# Patient Record
Sex: Male | Born: 1970 | Race: White | Hispanic: No | Smoking: Never smoker
Health system: Southern US, Community
[De-identification: ages and names within clinical notes are randomized; demographics above are authoritative.]

## PROBLEM LIST (undated history)

## (undated) DIAGNOSIS — I4510 Unspecified right bundle-branch block: Secondary | ICD-10-CM

## (undated) DIAGNOSIS — K5904 Chronic idiopathic constipation: Secondary | ICD-10-CM

## (undated) DIAGNOSIS — E1140 Type 2 diabetes mellitus with diabetic neuropathy, unspecified: Secondary | ICD-10-CM

## (undated) DIAGNOSIS — Z87442 Personal history of urinary calculi: Secondary | ICD-10-CM

## (undated) DIAGNOSIS — E785 Hyperlipidemia, unspecified: Secondary | ICD-10-CM

## (undated) DIAGNOSIS — G4700 Insomnia, unspecified: Secondary | ICD-10-CM

## (undated) DIAGNOSIS — E782 Mixed hyperlipidemia: Secondary | ICD-10-CM

## (undated) DIAGNOSIS — E119 Type 2 diabetes mellitus without complications: Secondary | ICD-10-CM

## (undated) DIAGNOSIS — F4310 Post-traumatic stress disorder, unspecified: Secondary | ICD-10-CM

## (undated) DIAGNOSIS — N529 Male erectile dysfunction, unspecified: Secondary | ICD-10-CM

## (undated) DIAGNOSIS — K603 Anal fistula, unspecified: Secondary | ICD-10-CM

## (undated) DIAGNOSIS — I10 Essential (primary) hypertension: Secondary | ICD-10-CM

## (undated) DIAGNOSIS — E1129 Type 2 diabetes mellitus with other diabetic kidney complication: Secondary | ICD-10-CM

## (undated) DIAGNOSIS — F32A Depression, unspecified: Secondary | ICD-10-CM

## (undated) DIAGNOSIS — M5030 Other cervical disc degeneration, unspecified cervical region: Secondary | ICD-10-CM

## (undated) DIAGNOSIS — Z9189 Other specified personal risk factors, not elsewhere classified: Secondary | ICD-10-CM

## (undated) DIAGNOSIS — R809 Proteinuria, unspecified: Secondary | ICD-10-CM

## (undated) DIAGNOSIS — R5382 Chronic fatigue, unspecified: Secondary | ICD-10-CM

## (undated) DIAGNOSIS — E291 Testicular hypofunction: Secondary | ICD-10-CM

## (undated) DIAGNOSIS — N183 Chronic kidney disease, stage 3 unspecified: Secondary | ICD-10-CM

## (undated) DIAGNOSIS — M1990 Unspecified osteoarthritis, unspecified site: Secondary | ICD-10-CM

## (undated) HISTORY — DX: Type 2 diabetes mellitus with diabetic neuropathy, unspecified: E11.40

## (undated) HISTORY — DX: Depression, unspecified: F32.A

## (undated) HISTORY — DX: Unspecified osteoarthritis, unspecified site: M19.90

## (undated) HISTORY — DX: Chronic kidney disease, stage 3 unspecified: N18.30

## (undated) HISTORY — DX: Type 2 diabetes mellitus without complications: E11.9

## (undated) HISTORY — PX: OTHER SURGICAL HISTORY: SHX169

## (undated) HISTORY — DX: Hyperlipidemia, unspecified: E78.5

---
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
## 2000-06-07 ENCOUNTER — Inpatient Hospital Stay (HOSPITAL_COMMUNITY): Admission: EM | Admit: 2000-06-07 | Discharge: 2000-06-10 | Payer: Self-pay | Admitting: *Deleted

## 2001-05-06 ENCOUNTER — Encounter (HOSPITAL_COMMUNITY): Admission: RE | Admit: 2001-05-06 | Discharge: 2001-06-05 | Payer: Self-pay | Admitting: Orthopedic Surgery

## 2002-02-05 ENCOUNTER — Emergency Department (HOSPITAL_COMMUNITY): Admission: EM | Admit: 2002-02-05 | Discharge: 2002-02-05 | Payer: Self-pay | Admitting: Emergency Medicine

## 2002-02-05 ENCOUNTER — Encounter: Payer: Self-pay | Admitting: Emergency Medicine

## 2005-10-21 ENCOUNTER — Emergency Department: Payer: Self-pay | Admitting: Emergency Medicine

## 2010-02-28 ENCOUNTER — Emergency Department: Payer: Self-pay | Admitting: Emergency Medicine

## 2010-11-14 ENCOUNTER — Emergency Department: Payer: Self-pay | Admitting: Emergency Medicine

## 2011-11-28 IMAGING — CR RIGHT FOOT - 2 VIEW
1 series · 2 of 2 positions shown · non-contrast
Comparison: none

REASON FOR EXAM: pain in heel  each morning while walking    flex 3
COMMENTS:   LMP: (Male)

PROCEDURE:     DXR - DXR FOOT RIGHT AP AND LATERAL  - November 14, 2010  [DATE]
RESULT:     There is no evidence of fracture, dislocation, or malalignment.

[Series 1: view not recorded · 0.17mm/px · 2 of 2 slices shown]
[im 1/2]
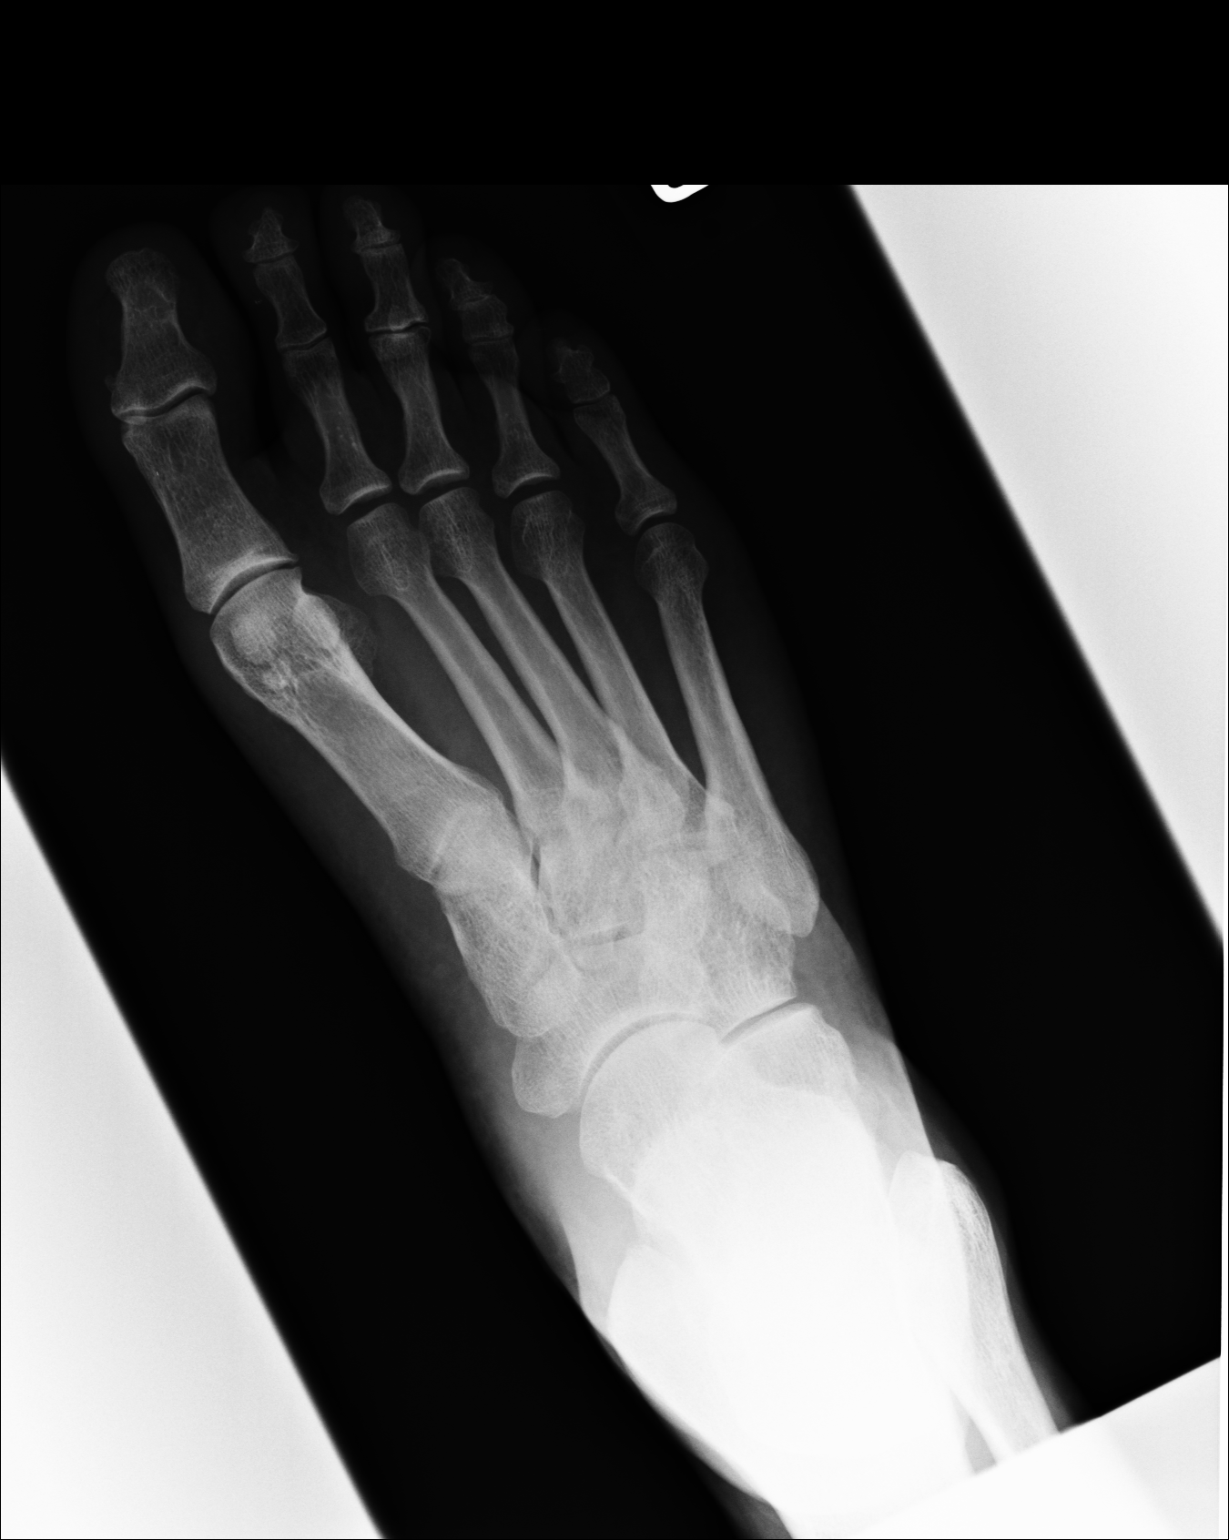
[im 2/2]
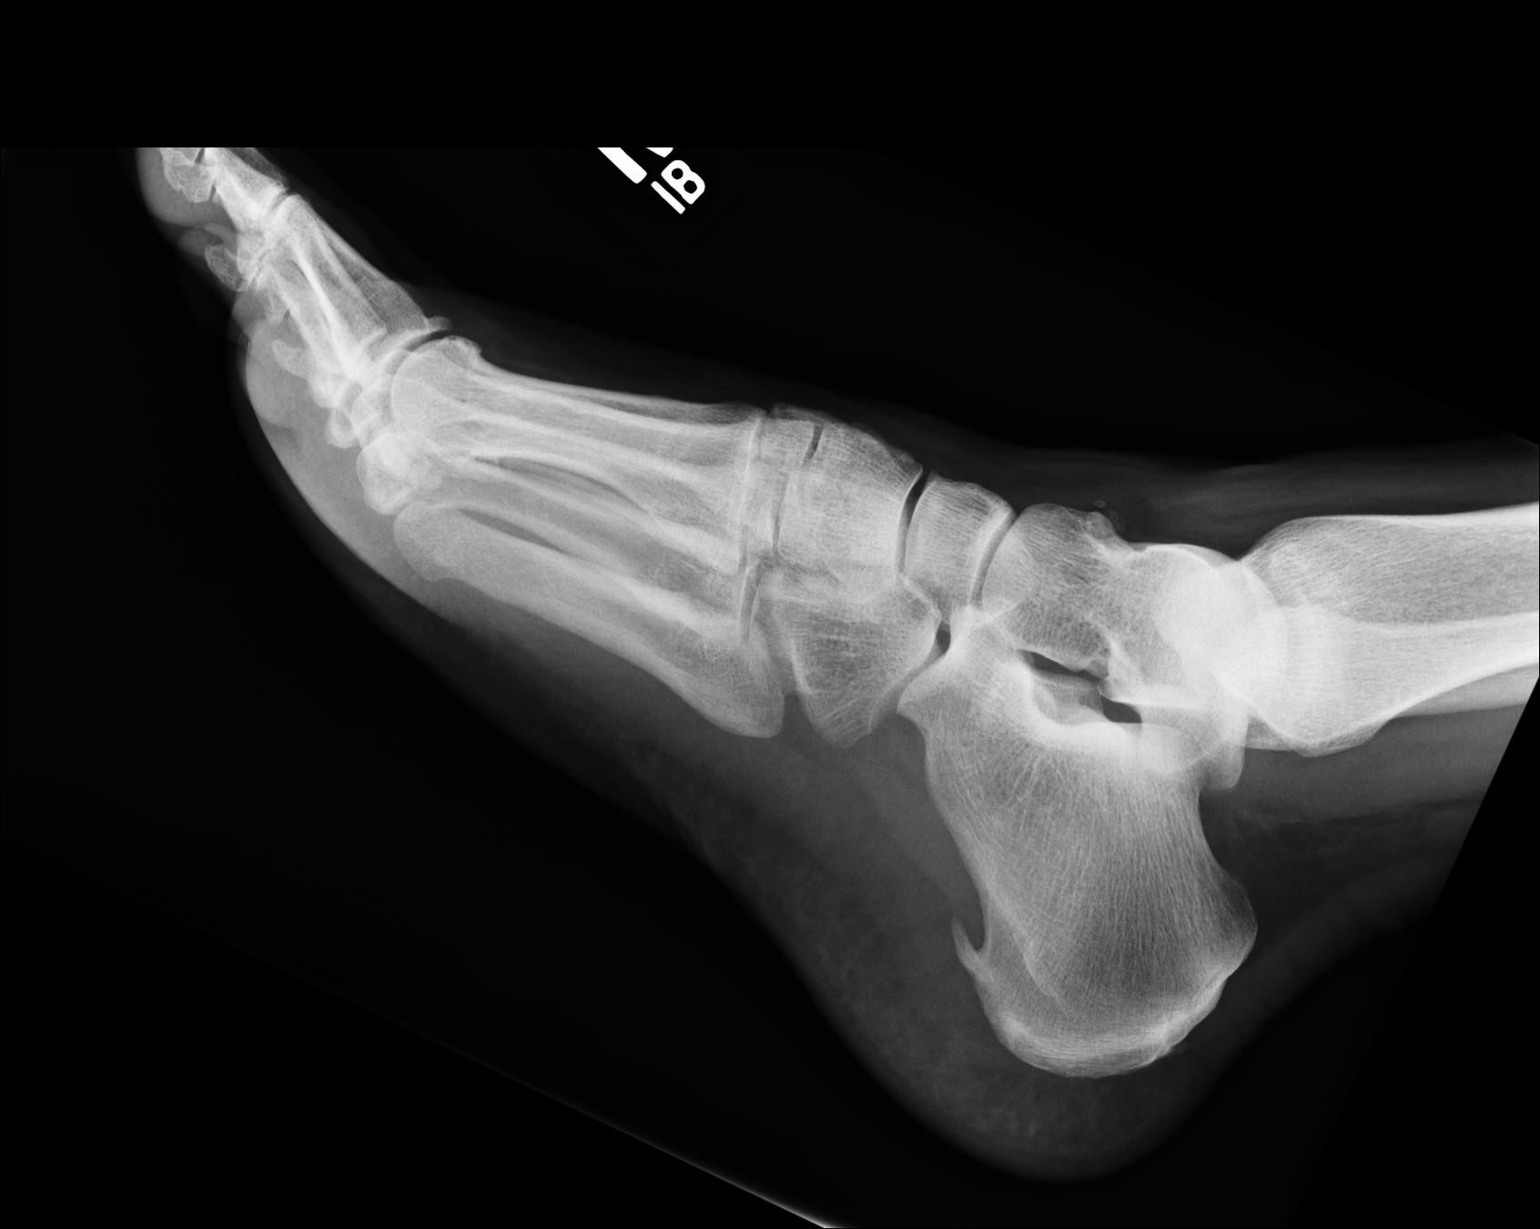

[2 of 2 positions shown; findings below may reference images not displayed]

IMPRESSION: 1. No evidence of acute abnormalities.
2. If there are persistent complaints of pain or persistent clinical
concern, a repeat evaluation in 7-10 days is recommended if clinically
warranted.

## 2019-10-24 ENCOUNTER — Emergency Department
Admission: EM | Admit: 2019-10-24 | Discharge: 2019-10-24 | Disposition: A | Discharge status: Home / Self Care | Payer: No Typology Code available for payment source | Attending: Emergency Medicine | Admitting: Emergency Medicine

## 2019-10-24 ENCOUNTER — Other Ambulatory Visit: Payer: Self-pay

## 2019-10-24 ENCOUNTER — Emergency Department: Payer: No Typology Code available for payment source

## 2019-10-24 ENCOUNTER — Encounter: Payer: Self-pay | Admitting: Emergency Medicine

## 2019-10-24 DIAGNOSIS — M542 Cervicalgia: Secondary | ICD-10-CM | POA: Diagnosis present

## 2019-10-24 DIAGNOSIS — Y9389 Activity, other specified: Secondary | ICD-10-CM | POA: Insufficient documentation

## 2019-10-24 DIAGNOSIS — M545 Low back pain: Secondary | ICD-10-CM | POA: Insufficient documentation

## 2019-10-24 DIAGNOSIS — Y999 Unspecified external cause status: Secondary | ICD-10-CM | POA: Diagnosis not present

## 2019-10-24 DIAGNOSIS — M79645 Pain in left finger(s): Secondary | ICD-10-CM | POA: Insufficient documentation

## 2019-10-24 DIAGNOSIS — Y92414 Local residential or business street as the place of occurrence of the external cause: Secondary | ICD-10-CM | POA: Diagnosis not present

## 2019-10-24 MED ORDER — CYCLOBENZAPRINE HCL 5 MG PO TABS: ORAL_TABLET | ORAL | 0 refills | Status: DC

## 2019-10-24 MED ORDER — LIDOCAINE 5 % EX PTCH: 1.0000 | MEDICATED_PATCH | CUTANEOUS | 0 refills | Status: DC

## 2019-10-24 MED ORDER — NAPROXEN SODIUM 220 MG PO TABS: 220.0000 mg | ORAL_TABLET | Freq: Two times a day (BID) | ORAL | 0 refills | Status: DC | PRN
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
## 2019-10-24 NOTE — Discharge Instructions (Addendum)
Your x-rays and CT do not show any fractures.  Please begin Aleve for pain and inflammation and Flexeril to help relax your muscles.  You can apply Lidoderm patch to your neck or low back.  Please follow-up with primary care next week for recheck.
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
## 2021-04-26 ENCOUNTER — Encounter: Payer: Self-pay | Admitting: Internal Medicine

## 2021-04-26 ENCOUNTER — Other Ambulatory Visit: Payer: Self-pay

## 2021-04-26 ENCOUNTER — Ambulatory Visit (INDEPENDENT_AMBULATORY_CARE_PROVIDER_SITE_OTHER): Payer: Medicare Other | Admitting: Internal Medicine

## 2021-04-26 VITALS — BP 162/94 | HR 82 | Temp 99.1°F | Resp 18 | Ht 74.0 in | Wt 330.8 lb

## 2021-04-26 DIAGNOSIS — R030 Elevated blood-pressure reading, without diagnosis of hypertension: Secondary | ICD-10-CM | POA: Diagnosis not present

## 2021-04-26 DIAGNOSIS — G4700 Insomnia, unspecified: Secondary | ICD-10-CM

## 2021-04-26 DIAGNOSIS — Z7689 Persons encountering health services in other specified circumstances: Secondary | ICD-10-CM

## 2021-04-26 DIAGNOSIS — Z1159 Encounter for screening for other viral diseases: Secondary | ICD-10-CM

## 2021-04-26 DIAGNOSIS — F1290 Cannabis use, unspecified, uncomplicated: Secondary | ICD-10-CM | POA: Diagnosis not present

## 2021-04-26 DIAGNOSIS — L0882 Omphalitis not of newborn: Secondary | ICD-10-CM

## 2021-04-26 DIAGNOSIS — M7730 Calcaneal spur, unspecified foot: Secondary | ICD-10-CM

## 2021-04-26 DIAGNOSIS — Z131 Encounter for screening for diabetes mellitus: Secondary | ICD-10-CM

## 2021-04-26 DIAGNOSIS — F4310 Post-traumatic stress disorder, unspecified: Secondary | ICD-10-CM

## 2021-04-26 DIAGNOSIS — Z114 Encounter for screening for human immunodeficiency virus [HIV]: Secondary | ICD-10-CM

## 2021-04-26 DIAGNOSIS — E559 Vitamin D deficiency, unspecified: Secondary | ICD-10-CM

## 2021-04-26 MED ORDER — CLOBETASOL PROPIONATE 0.05 % EX CREA: 1.0000 "application " | TOPICAL_CREAM | Freq: Two times a day (BID) | CUTANEOUS | 0 refills | Status: DC

## 2021-04-26 MED ORDER — TRAZODONE HCL 50 MG PO TABS: 50.0000 mg | ORAL_TABLET | Freq: Every evening | ORAL | 0 refills | Status: DC | PRN

## 2021-04-26 NOTE — Assessment & Plan Note (Signed)
Serous/mucoid discharge from umbilical area Clobetasol cream prescribed

## 2021-04-26 NOTE — Assessment & Plan Note (Signed)
BP Readings from Last 1 Encounters:  04/26/21 (!) 162/94   New-onset, could be related to anxiety related to first visit If persistent, will start on ACEi/ARB and/or CCB Advised DASH diet and moderate exercise/walking, at least 150 mins/week

## 2021-04-26 NOTE — Assessment & Plan Note (Signed)
Was on Ambien in the past, but had overdosed on it Has been smoking marijuana for anxiety/insomnia Sleep hygiene material provided Trial of Trazodone
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
## 2021-04-26 NOTE — Assessment & Plan Note (Signed)
Care established Previous chart reviewed History and medications reviewed with the patient 
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
## 2021-04-26 NOTE — Assessment & Plan Note (Signed)
H/o heel spur Referred to Podiatry for further management Heating pad/ice Tylenol/Ibuprofen PRN

## 2021-04-26 NOTE — Patient Instructions (Signed)
Please

## 2021-04-26 NOTE — Assessment & Plan Note (Signed)
Diet modification and moderate exercise/walking advised 

## 2021-04-26 NOTE — Assessment & Plan Note (Signed)
Smokes Marijuana for anxiety/insomnia Advised to avoid - patient expressed understanding.

## 2021-04-27 ENCOUNTER — Encounter: Payer: Self-pay | Admitting: Podiatry

## 2021-04-27 ENCOUNTER — Ambulatory Visit (INDEPENDENT_AMBULATORY_CARE_PROVIDER_SITE_OTHER): Payer: Medicare Other | Admitting: Podiatry

## 2021-04-27 ENCOUNTER — Ambulatory Visit (INDEPENDENT_AMBULATORY_CARE_PROVIDER_SITE_OTHER): Payer: Medicare Other

## 2021-04-27 ENCOUNTER — Other Ambulatory Visit: Payer: Self-pay | Admitting: Podiatry

## 2021-04-27 ENCOUNTER — Ambulatory Visit: Payer: No Typology Code available for payment source | Admitting: Podiatry

## 2021-04-27 DIAGNOSIS — M19072 Primary osteoarthritis, left ankle and foot: Secondary | ICD-10-CM

## 2021-04-27 DIAGNOSIS — M19071 Primary osteoarthritis, right ankle and foot: Secondary | ICD-10-CM

## 2021-04-27 DIAGNOSIS — M722 Plantar fascial fibromatosis: Secondary | ICD-10-CM

## 2021-04-27 DIAGNOSIS — M19079 Primary osteoarthritis, unspecified ankle and foot: Secondary | ICD-10-CM | POA: Diagnosis not present

## 2021-04-27 LAB — CMP14+EGFR
ALT: 27 IU/L (ref 0–44)
AST: 17 IU/L (ref 0–40)
Albumin/Globulin Ratio: 1.7 (ref 1.2–2.2)
Albumin: 4.8 g/dL (ref 4.0–5.0)
Alkaline Phosphatase: 77 IU/L (ref 44–121)
BUN/Creatinine Ratio: 14 (ref 9–20)
BUN: 23 mg/dL (ref 6–24)
Bilirubin Total: 0.4 mg/dL (ref 0.0–1.2)
CO2: 23 mmol/L (ref 20–29)
Calcium: 10.1 mg/dL (ref 8.7–10.2)
Chloride: 99 mmol/L (ref 96–106)
Creatinine, Ser: 1.7 mg/dL — ABNORMAL HIGH (ref 0.76–1.27)
Globulin, Total: 2.9 g/dL (ref 1.5–4.5)
Glucose: 129 mg/dL — ABNORMAL HIGH (ref 65–99)
Potassium: 4.8 mmol/L (ref 3.5–5.2)
Sodium: 139 mmol/L (ref 134–144)
Total Protein: 7.7 g/dL (ref 6.0–8.5)
eGFR: 49 mL/min/{1.73_m2} — ABNORMAL LOW (ref >59)

## 2021-04-27 LAB — CBC
Hematocrit: 51.5 % — ABNORMAL HIGH (ref 37.5–51.0)
Hemoglobin: 17.7 g/dL (ref 13.0–17.7)
MCH: 28.8 pg (ref 26.6–33.0)
MCHC: 34.4 g/dL (ref 31.5–35.7)
MCV: 84 fL (ref 79–97)
Platelets: 276 10*3/uL (ref 150–450)
RBC: 6.14 x10E6/uL — ABNORMAL HIGH (ref 4.14–5.80)
RDW: 13.2 % (ref 11.6–15.4)
WBC: 8.1 10*3/uL (ref 3.4–10.8)

## 2021-04-27 LAB — TSH+FREE T4
Free T4: 1.07 ng/dL (ref 0.82–1.77)
TSH: 0.919 u[IU]/mL (ref 0.450–4.500)

## 2021-04-27 LAB — HIV ANTIBODY (ROUTINE TESTING W REFLEX): HIV Screen 4th Generation wRfx: NONREACTIVE

## 2021-04-27 LAB — VITAMIN D 25 HYDROXY (VIT D DEFICIENCY, FRACTURES): Vit D, 25-Hydroxy: 18.1 ng/mL — ABNORMAL LOW (ref 30.0–100.0)

## 2021-04-27 LAB — HEPATITIS C ANTIBODY: Hep C Virus Ab: <0.1 s/co ratio (ref 0.0–0.9)

## 2021-04-27 LAB — LIPID PANEL
Chol/HDL Ratio: 8.8 ratio — ABNORMAL HIGH (ref 0.0–5.0)
Cholesterol, Total: 282 mg/dL — ABNORMAL HIGH (ref 100–199)
HDL: 32 mg/dL — ABNORMAL LOW (ref >39)
LDL Chol Calc (NIH): 151 mg/dL — ABNORMAL HIGH (ref 0–99)
Triglycerides: 514 mg/dL — ABNORMAL HIGH (ref 0–149)
VLDL Cholesterol Cal: 99 mg/dL — ABNORMAL HIGH (ref 5–40)

## 2021-04-27 MED ORDER — MELOXICAM 15 MG PO TABS: 15.0000 mg | ORAL_TABLET | Freq: Every day | ORAL | 3 refills | Status: DC

## 2021-04-27 MED ORDER — GABAPENTIN 300 MG PO CAPS: 300.0000 mg | ORAL_CAPSULE | Freq: Two times a day (BID) | ORAL | 3 refills | Status: DC

## 2021-04-27 NOTE — Progress Notes (Signed)
  Subjective:  Patient ID: Francisco Huffman, male    DOB: [birth year],  MRN: [id number] HPI Chief Complaint  Patient presents with   Foot Pain    1st MPJ/dorsal midfoot/arch and medial foot/some plantar heel bilateral - aching, burning, tingling, numbness x 3-4 months, Dx: heel spurs 15 years ago, worse at night, swollen dorsally, did kickboxing for years   Nail Problem    Hallux bilateral - toenails thick and discolored   New Patient (Initial Visit)    50 y.o. male presents with the above complaint.   ROS: Denies fever chills nausea vomiting muscle aches pains calf pain back pain chest pain shortness of breath.  Has not seen Dr. In years and has recently had blood work done.  History of manic depression.  No past medical history on file. No past surgical history on file.  Current Outpatient Medications:    gabapentin (NEURONTIN) 300 MG capsule, Take 1 capsule (300 mg total) by mouth 2 (two) times daily., Disp: 60 capsule, Rfl: 3   meloxicam (MOBIC) 15 MG tablet, Take 1 tablet (15 mg total) by mouth daily., Disp: 30 tablet, Rfl: 3   clobetasol cream (TEMOVATE) 0.05 %, Apply 1 application topically 2 (two) times daily., Disp: 30 g, Rfl: 0   traZODone (DESYREL) 50 MG tablet, Take 1 tablet (50 mg total) by mouth at bedtime as needed for sleep., Disp: 30 tablet, Rfl: 0  No Known Allergies Review of Systems Objective:  There were no vitals filed for this visit.  General: Well developed, nourished, in no acute distress, alert and oriented x3   Dermatological: Skin is warm, dry and supple bilateral. Nails x 10 are well maintained; remaining integument appears unremarkable at this time. There are no open sores, no preulcerative lesions, no rash or signs of infection present.  Vascular: Dorsalis Pedis artery and Posterior Tibial artery pedal pulses are 2/4 PT bilateral 0/4 DP with immedate capillary fill time. Pedal hair growth present. No varicosities and no lower extremity edema present  bilateral.   Neruologic: Grossly intact via light touch bilateral. Vibratory intact via tuning fork bilateral. Protective threshold with Semmes Wienstein monofilament i absent to the level of the ankle and to all pedal sites bilateral. Patellar and Achilles deep tendon reflexes 2+ bilateral. No no plantar responses bilateral  Musculoskeletal: No gross boney pedal deformities bilateral. No pain, crepitus, or limitation noted with foot and ankle range of motion bilateral. Muscular strength 5/5 in all groups tested bilateral.  No reproducible pain at all on palpation.  Gait: Unassisted, Nonantalgic.    Radiographs:  Radiographs taken today demonstrate an osseously mature individual with posterior and plantar calcaneal heel spurs osteoarthritic changes on lateral view of the midfoot.  Has osteoarthritic changes of the first metatarsophalangeal joint with dorsal spurring  Assessment & Plan:   Assessment: Morbid obesity.  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  Osteoarthritis bilateral foot and probable plantar fasciitis bilateral.  Plan: Discussed etiology pathology conservative surgical therapies at this point I will start him on gabapentin 300 mg twice a day.  I also will start him on meloxicam 15 mg 1 p.o. daily we will await his blood work performed by Dr. Allena Katz.  I would like to follow-up with him in about 1 month for med check     Erla Bacchi T. [place], [place]

## 2021-04-28 ENCOUNTER — Other Ambulatory Visit: Payer: Self-pay | Admitting: Internal Medicine

## 2021-04-28 DIAGNOSIS — E782 Mixed hyperlipidemia: Secondary | ICD-10-CM

## 2021-04-28 MED ORDER — ROSUVASTATIN CALCIUM 20 MG PO TABS: 20.0000 mg | ORAL_TABLET | Freq: Every day | ORAL | 1 refills | Status: DC

## 2021-05-04 ENCOUNTER — Ambulatory Visit (INDEPENDENT_AMBULATORY_CARE_PROVIDER_SITE_OTHER): Payer: Medicare Other | Admitting: Internal Medicine

## 2021-05-04 ENCOUNTER — Encounter: Payer: Self-pay | Admitting: Internal Medicine

## 2021-05-04 ENCOUNTER — Other Ambulatory Visit: Payer: Self-pay

## 2021-05-04 VITALS — BP 131/81 | HR 78 | Temp 98.9°F | Resp 18 | Ht 74.0 in | Wt 331.0 lb

## 2021-05-04 DIAGNOSIS — E782 Mixed hyperlipidemia: Secondary | ICD-10-CM | POA: Insufficient documentation

## 2021-05-04 DIAGNOSIS — Z131 Encounter for screening for diabetes mellitus: Secondary | ICD-10-CM

## 2021-05-04 DIAGNOSIS — L0882 Omphalitis not of newborn: Secondary | ICD-10-CM

## 2021-05-04 DIAGNOSIS — E559 Vitamin D deficiency, unspecified: Secondary | ICD-10-CM | POA: Diagnosis not present

## 2021-05-04 DIAGNOSIS — E1140 Type 2 diabetes mellitus with diabetic neuropathy, unspecified: Secondary | ICD-10-CM | POA: Diagnosis not present

## 2021-05-04 LAB — POCT GLYCOSYLATED HEMOGLOBIN (HGB A1C): HbA1c, POC (controlled diabetic range): 7.6 % — AB (ref 0.0–7.0)

## 2021-05-04 MED ORDER — METFORMIN HCL 500 MG PO TABS: 500.0000 mg | ORAL_TABLET | Freq: Two times a day (BID) | ORAL | 0 refills | Status: DC

## 2021-05-04 NOTE — Assessment & Plan Note (Signed)
Serous/mucoid with intermittent blood tinged discharge from umbilical area Clobetasol cream prescribed

## 2021-05-04 NOTE — Assessment & Plan Note (Signed)
Lipid profile reviewed Started Crestor 

## 2021-05-04 NOTE — Patient Instructions (Signed)
Please start taking Metformin for diabetes. Please follow low carb and low cholesterol diet and perform moderate exercise/walking at least 150 mins/week.  Please start taking Vitamin D 5000 IU once daily for 6 months.  Please continue to take other medications as prescribed.

## 2021-05-04 NOTE — Assessment & Plan Note (Signed)
Last vitamin D Lab Results  Component Value Date   VD25OH 18.1 (L) 04/26/2021   Advised to take Vitamin D 5000 IU QD for now

## 2021-05-04 NOTE — Assessment & Plan Note (Signed)
HbA1C: 7.6 today in office Started Metformin Advised to follow diabetic diet On statin (recently started) F/u CMP and lipid panel later Diabetic foot exam: Today Diabetic eye exam: Advised to follow up with Ophthalmology for diabetic eye exam

## 2021-05-04 NOTE — Progress Notes (Signed)
Established Patient Office Visit  Subjective:  Patient ID: Francisco Huffman, male    DOB: Mar 21, 1971  Age: 49 y.o. MRN: [id number]  CC:  Chief Complaint  Patient presents with   Follow-up    1 week follow up pt stated that since taking the gabapentin he has blurred vision feeling drunk and back hurting however he has been able to not have pain at night and he was able to walk through [place] without stopping     HPI Francisco Huffman is a 50 year old male with PMH of DM with neuropathy, HLD, CKD stage 3a, PTSD, insomnia and morbid obesity who presents for follow up of his BP and blood tests.  His BP was WNL today. Denies any headache, dizziness, chest pain, dyspnea or palpitations.  His sleep has improved with Trazodone and has only 1 episode of awakening in a night now. He is doing better with Gabapentin and his foot pain has improved significantly.  His HbA1C was 7.6 today. He has FH of DM. He denies polyphagia or polydipsia. His kidney function shows CKD stage 3a.  Past Medical History:  Diagnosis Date   CKD (chronic kidney disease) stage 3, GFR 30-59 ml/min (HCC)    Diabetes mellitus without complication (HCC)    Diabetic neuropathy ([place])    Hyperlipidemia     History reviewed. No pertinent surgical history.  History reviewed. No pertinent family history.  Social History   Socioeconomic History   Marital status: Divorced    Spouse name: Not on file   Number of children: Not on file   Years of education: Not on file   Highest education level: Not on file  Occupational History   Not on file  Tobacco Use   Smoking status: Never   Smokeless tobacco: Never  Substance and Sexual Activity   Alcohol use: Never   Drug use: Yes    Types: Marijuana    Comment: uses at night   Sexual activity: Not on file  Other Topics Concern   Not on file  Social History Narrative   Not on file   Social Determinants of Health   Financial Resource Strain: Not on file  Food Insecurity:  Not on file  Transportation Needs: Not on file  Physical Activity: Not on file  Stress: Not on file  Social Connections: Not on file  Intimate Partner Violence: Not on file    Outpatient Medications Prior to Visit  Medication Sig Dispense Refill   clobetasol cream (TEMOVATE) 0.86 % Apply 1 application topically 2 (two) times daily. 30 g 0   gabapentin (NEURONTIN) 300 MG capsule Take 1 capsule (300 mg total) by mouth 2 (two) times daily. 60 capsule 3   meloxicam (MOBIC) 15 MG tablet Take 1 tablet (15 mg total) by mouth daily. 30 tablet 3   rosuvastatin (CRESTOR) 20 MG tablet Take 1 tablet (20 mg total) by mouth daily. 90 tablet 1   traZODone (DESYREL) 50 MG tablet Take 1 tablet (50 mg total) by mouth at bedtime as needed for sleep. 30 tablet 0   No facility-administered medications prior to visit.    No Known Allergies  ROS Review of Systems  Constitutional:  Negative for chills and fever.  HENT:  Negative for congestion and sore throat.   Eyes:  Negative for pain and discharge.  Respiratory:  Negative for cough and shortness of breath.   Cardiovascular:  Negative for chest pain and palpitations.  Gastrointestinal:  Negative for constipation, diarrhea, nausea and vomiting.  Endocrine: Negative for polydipsia and polyuria.  Genitourinary:  Negative for dysuria and hematuria.  Musculoskeletal:  Negative for neck pain and neck stiffness.       B/l heel pain  Skin:  Negative for rash.  Neurological:  Negative for dizziness, weakness, numbness and headaches.  Psychiatric/Behavioral:  Positive for agitation, dysphoric mood and sleep disturbance. Negative for behavioral problems. The patient is nervous/anxious.      Objective:    Physical Exam Vitals reviewed.  Constitutional:      General: He is not in acute distress.    Appearance: He is obese. He is not diaphoretic.  HENT:     Head: Normocephalic and atraumatic.     Nose: Nose normal.     Mouth/Throat:     Mouth: Mucous  membranes are moist.  Eyes:     General: No scleral icterus.    Extraocular Movements: Extraocular movements intact.  Cardiovascular:     Rate and Rhythm: Normal rate and regular rhythm.     Pulses: Normal pulses.     Heart sounds: No murmur heard. Pulmonary:     Breath sounds: Normal breath sounds. No wheezing or rales.  Abdominal:     Palpations: Abdomen is soft.     Tenderness: There is no abdominal tenderness.     Comments: Mucoid discharge from umbilicus  Musculoskeletal:     Cervical back: Neck supple. No tenderness.     Right lower leg: No edema.     Left lower leg: No edema.  Skin:    General: Skin is warm.     Findings: No rash.  Neurological:     General: No focal deficit present.     Mental Status: He is alert and oriented to person, place, and time.  Psychiatric:        Mood and Affect: Mood normal.        Behavior: Behavior normal.    BP 131/81 (BP Location: Left Arm, Patient Position: Sitting, Cuff Size: Normal)   Pulse 78   Temp 98.9 F (37.2 C) (Oral)   Resp 18   Ht 6' 2"  (1.88 m)   Wt (!) 331 lb (150.1 kg)   SpO2 96%   BMI 42.50 kg/m  Wt Readings from Last 3 Encounters:  05/04/21 (!) 331 lb (150.1 kg)  04/26/21 (!) 330 lb 12.8 oz (150 kg)  10/24/19 (!) 340 lb (154.2 kg)     Health Maintenance Due  Topic Date Due   PNEUMOCOCCAL POLYSACCHARIDE VACCINE AGE 51-64 HIGH RISK  Never done   COVID-19 Vaccine (1) Never done   OPHTHALMOLOGY EXAM  Never done   TETANUS/TDAP  Never done   COLONOSCOPY (Pts 45-45yr Insurance coverage will need to be confirmed)  Never done    There are no preventive care reminders to display for this patient.  Lab Results  Component Value Date   TSH 0.919 04/26/2021   Lab Results  Component Value Date   WBC 8.1 04/26/2021   HGB 17.7 04/26/2021   HCT 51.5 (H) 04/26/2021   MCV 84 04/26/2021   PLT 276 04/26/2021   Lab Results  Component Value Date   NA 139 04/26/2021   K 4.8 04/26/2021   CO2 23 04/26/2021    GLUCOSE 129 (H) 04/26/2021   BUN 23 04/26/2021   CREATININE 1.70 (H) 04/26/2021   BILITOT 0.4 04/26/2021   ALKPHOS 77 04/26/2021   AST 17 04/26/2021   ALT 27 04/26/2021   PROT 7.7 04/26/2021   ALBUMIN 4.8 04/26/2021  CALCIUM 10.1 04/26/2021   EGFR 49 (L) 04/26/2021   Lab Results  Component Value Date   CHOL 282 (H) 04/26/2021   Lab Results  Component Value Date   HDL 32 (L) 04/26/2021   Lab Results  Component Value Date   LDLCALC 151 (H) 04/26/2021   Lab Results  Component Value Date   TRIG 514 (H) 04/26/2021   Lab Results  Component Value Date   CHOLHDL 8.8 (H) 04/26/2021   Lab Results  Component Value Date   HGBA1C 7.6 (A) 05/04/2021      Assessment & Plan:   Problem List Items Addressed This Visit       Endocrine   Type 2 diabetes mellitus with diabetic neuropathy, without long-term current use of insulin (HCC) - Primary    HbA1C: 7.6 today in office Started Metformin Advised to follow diabetic diet On statin (recently started) F/u CMP and lipid panel later Diabetic foot exam: Today Diabetic eye exam: Advised to follow up with Ophthalmology for diabetic eye exam        Relevant Medications   metFORMIN (GLUCOPHAGE) 500 MG tablet     Other   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    Serous/mucoid with intermittent blood tinged discharge from umbilical area Clobetasol cream prescribed       Mixed hyperlipidemia    Lipid profile reviewed Started Crestor       Vitamin D deficiency    Last vitamin D Lab Results  Component Value Date   VD25OH 18.1 (L) 04/26/2021  Advised to take Vitamin D 5000 IU QD for now       Other Visit Diagnoses     Screening for diabetes mellitus       Special screening examination for diabetes mellitus       Relevant Orders   POCT glycosylated hemoglobin (Hb A1C) (Completed)       Meds ordered this encounter  Medications   metFORMIN (GLUCOPHAGE) 500 MG tablet    Sig: Take 1 tablet (500 mg total) by mouth 2 (two) times  daily with a meal.    Dispense:  180 tablet    Refill:  0    Follow-up: Return in about 3 months (around 08/04/2021) for DM, HLD and CKD.    Lindell Spar, MD

## 2021-05-05 ENCOUNTER — Telehealth: Payer: Self-pay

## 2021-05-05 NOTE — Telephone Encounter (Signed)
Patient called he has questions and concerns about metformin ph# [phone]

## 2021-05-05 NOTE — Telephone Encounter (Signed)
Pt has a friend that is a diabetic and he did research on it and it showed in 2020 that this medication was recalled and causes cancer and he wants to know if this would be safe to take and also would it be better for him to take fish oil instead of taking metformin please advise

## 2021-05-05 NOTE — Telephone Encounter (Signed)
LVM letting pt know that the metformin was the safest option.

## 2021-05-23 ENCOUNTER — Other Ambulatory Visit: Payer: Self-pay | Admitting: Internal Medicine

## 2021-05-23 DIAGNOSIS — G4700 Insomnia, unspecified: Secondary | ICD-10-CM

## 2021-06-13 ENCOUNTER — Telehealth: Payer: Self-pay | Admitting: Podiatry

## 2021-06-13 ENCOUNTER — Ambulatory Visit: Payer: Medicare Other | Admitting: Podiatry

## 2021-06-13 NOTE — Telephone Encounter (Signed)
Pt called stating his foot is beginning to hurt and the medication that was previously prescribed isn't working as well as before. He would like to know what to do about this. He is also requesting diabetic shoes. Please advise.

## 2021-06-17 ENCOUNTER — Telehealth: Payer: Self-pay | Admitting: Podiatry

## 2021-06-17 MED ORDER — GABAPENTIN 300 MG PO CAPS: 300.0000 mg | ORAL_CAPSULE | Freq: Three times a day (TID) | ORAL | 1 refills | Status: DC

## 2021-06-17 NOTE — Telephone Encounter (Signed)
Called pt let him know that MD called in a RX to his pharmacy.

## 2021-06-17 NOTE — Telephone Encounter (Signed)
Patient called in the office today stating that he has an appt with Dr Al Corpus but he has some questions about the Gabapentin  Pt states he is in pain and wanted to know if he could double the dosage. Let pt know I was sending a message to nurse. Patients tele # is [phone].

## 2021-06-21 ENCOUNTER — Other Ambulatory Visit: Payer: Self-pay | Admitting: Internal Medicine

## 2021-06-21 DIAGNOSIS — G4700 Insomnia, unspecified: Secondary | ICD-10-CM

## 2021-06-28 ENCOUNTER — Encounter: Payer: Self-pay | Admitting: Internal Medicine

## 2021-06-28 ENCOUNTER — Other Ambulatory Visit: Payer: Self-pay

## 2021-06-28 ENCOUNTER — Ambulatory Visit (INDEPENDENT_AMBULATORY_CARE_PROVIDER_SITE_OTHER): Payer: Medicare Other | Admitting: Internal Medicine

## 2021-06-28 VITALS — BP 156/92 | HR 72 | Temp 98.8°F | Resp 18 | Ht 74.0 in | Wt 327.0 lb

## 2021-06-28 DIAGNOSIS — G4700 Insomnia, unspecified: Secondary | ICD-10-CM | POA: Diagnosis not present

## 2021-06-28 DIAGNOSIS — E1140 Type 2 diabetes mellitus with diabetic neuropathy, unspecified: Secondary | ICD-10-CM | POA: Diagnosis not present

## 2021-06-28 DIAGNOSIS — F5232 Male orgasmic disorder: Secondary | ICD-10-CM

## 2021-06-28 MED ORDER — TRAZODONE HCL 100 MG PO TABS: 100.0000 mg | ORAL_TABLET | Freq: Every evening | ORAL | 3 refills | Status: DC | PRN

## 2021-06-28 NOTE — Assessment & Plan Note (Signed)
HbA1C: 7.6 Continue Metformin Advised to follow diabetic diet On statin F/u BMP Diabetic eye exam: Advised to follow up with Ophthalmology for diabetic eye exam

## 2021-06-28 NOTE — Assessment & Plan Note (Signed)
Has been smoking marijuana for anxiety/insomnia Sleep hygiene material provided Increased Trazodone to 100 mg QD

## 2021-06-28 NOTE — Patient Instructions (Signed)
Please start taking Trazodone 100 mg instead of 50 mg.  Continue taking other medications as prescribed.  Continue to follow low carb diet and perform moderate exercise/walking at least 150 mins/week.

## 2021-06-29 ENCOUNTER — Other Ambulatory Visit: Payer: Self-pay | Admitting: Internal Medicine

## 2021-06-29 ENCOUNTER — Ambulatory Visit (INDEPENDENT_AMBULATORY_CARE_PROVIDER_SITE_OTHER): Payer: Medicare Other | Admitting: Podiatry

## 2021-06-29 ENCOUNTER — Encounter: Payer: Self-pay | Admitting: Podiatry

## 2021-06-29 DIAGNOSIS — M19071 Primary osteoarthritis, right ankle and foot: Secondary | ICD-10-CM

## 2021-06-29 DIAGNOSIS — N1831 Chronic kidney disease, stage 3a: Secondary | ICD-10-CM

## 2021-06-29 DIAGNOSIS — M19072 Primary osteoarthritis, left ankle and foot: Secondary | ICD-10-CM | POA: Diagnosis not present

## 2021-06-29 DIAGNOSIS — E1142 Type 2 diabetes mellitus with diabetic polyneuropathy: Secondary | ICD-10-CM

## 2021-06-29 DIAGNOSIS — F5232 Male orgasmic disorder: Secondary | ICD-10-CM | POA: Insufficient documentation

## 2021-06-29 DIAGNOSIS — L603 Nail dystrophy: Secondary | ICD-10-CM | POA: Diagnosis not present

## 2021-06-29 LAB — BASIC METABOLIC PANEL WITH GFR
BUN/Creatinine Ratio: 15 (ref 9–20)
BUN: 24 mg/dL (ref 6–24)
CO2: 24 mmol/L (ref 20–29)
Calcium: 9.9 mg/dL (ref 8.7–10.2)
Chloride: 101 mmol/L (ref 96–106)
Creatinine, Ser: 1.59 mg/dL — ABNORMAL HIGH (ref 0.76–1.27)
Glucose: 199 mg/dL — ABNORMAL HIGH (ref 65–99)
Potassium: 4.9 mmol/L (ref 3.5–5.2)
Sodium: 139 mmol/L (ref 134–144)
eGFR: 53 mL/min/1.73 — ABNORMAL LOW

## 2021-06-29 MED ORDER — GABAPENTIN 300 MG PO CAPS: 300.0000 mg | ORAL_CAPSULE | Freq: Three times a day (TID) | ORAL | 3 refills | Status: DC

## 2021-06-29 NOTE — Progress Notes (Signed)
He presents today stating that the gabapentin 300 mg 3 times a day seems to be doing the trick at this point.  He would like to consider diabetic shoes since he has a history of diabetes with the severe neuropathy and hammertoe deformities.  He states that that is the only thing that I can think of that may make my feet feel better with shoes on.  Objective: Vital signs are stable he is alert and oriented x3 I reviewed his past medical history medications allergies surgery social history.  Hammertoe deformities 2 through 5 bilaterally.  With thick yellow dystrophic clinically mycotic nails.  He also has moderate to severe neuropathy per Phoebe Perch monofilament.  Assessment: Diabetic peripheral neuropathy bilaterally.  Nail dystrophy hallux.  Plan: Sample of the skin and nail today did she be sent for pathologic evaluation and we scheduled him with [facility] for diabetic shoes.

## 2021-06-29 NOTE — Assessment & Plan Note (Signed)
Could be due to Gabapentin, advised to ask Podiatry for alternative He prefers to stay on Gabapentin for now as it has been helping with neuropathic pain

## 2021-06-29 NOTE — Progress Notes (Signed)
Acute Office Visit  Subjective:    Patient ID: Francisco Huffman, male    DOB: 06-Nov-1971, 50 y.o.   MRN: [id number]  Chief Complaint  Patient presents with   Follow-up    Med check trazodone this was helping but it is no longer helping would like to discuss diabetic shoes     HPI Patient is in today for evaluation of insomnia. He has been taking Trazodone 50 mg, which helped him with insomnia in the beginning, but has not been helping him now. He wakes up about 4-5 times in the night. Denies any anhedonia, SI or HI.  He also reports orgasm problem, but denies any problem with erection. He asks about any medication that could be causing it.  Past Medical History:  Diagnosis Date   CKD (chronic kidney disease) stage 3, GFR 30-59 ml/min (HCC)    Diabetes mellitus without complication (HCC)    Diabetic neuropathy ([place])    Hyperlipidemia     History reviewed. No pertinent surgical history.  History reviewed. No pertinent family history.  Social History   Socioeconomic History   Marital status: Divorced    Spouse name: Not on file   Number of children: Not on file   Years of education: Not on file   Highest education level: Not on file  Occupational History   Not on file  Tobacco Use   Smoking status: Never   Smokeless tobacco: Never  Substance and Sexual Activity   Alcohol use: Never   Drug use: Yes    Types: Marijuana    Comment: uses at night   Sexual activity: Not on file  Other Topics Concern   Not on file  Social History Narrative   Not on file   Social Determinants of Health   Financial Resource Strain: Not on file  Food Insecurity: Not on file  Transportation Needs: Not on file  Physical Activity: Not on file  Stress: Not on file  Social Connections: Not on file  Intimate Partner Violence: Not on file    Outpatient Medications Prior to Visit  Medication Sig Dispense Refill   clobetasol cream (TEMOVATE) 3.78 % Apply 1 application topically 2 (two)  times daily. 30 g 0   gabapentin (NEURONTIN) 300 MG capsule Take 1 capsule (300 mg total) by mouth 2 (two) times daily. 60 capsule 3   gabapentin (NEURONTIN) 300 MG capsule Take 1 capsule (300 mg total) by mouth 3 (three) times daily. 90 capsule 1   meloxicam (MOBIC) 15 MG tablet Take 1 tablet (15 mg total) by mouth daily. 30 tablet 3   metFORMIN (GLUCOPHAGE) 500 MG tablet Take 1 tablet (500 mg total) by mouth 2 (two) times daily with a meal. 180 tablet 0   rosuvastatin (CRESTOR) 20 MG tablet Take 1 tablet (20 mg total) by mouth daily. 90 tablet 1   traZODone (DESYREL) 50 MG tablet TAKE 1 TABLET BY MOUTH EVERY DAY AT BEDTIME AS NEEDED FOR SLEEP 30 tablet 0   No facility-administered medications prior to visit.    No Known Allergies  Review of Systems  Constitutional:  Negative for chills and fever.  HENT:  Negative for congestion and sore throat.   Eyes:  Negative for pain and discharge.  Respiratory:  Negative for cough and shortness of breath.   Cardiovascular:  Negative for chest pain and palpitations.  Gastrointestinal:  Negative for constipation, diarrhea, nausea and vomiting.  Endocrine: Negative for polydipsia and polyuria.  Genitourinary:  Negative for dysuria and  hematuria.  Musculoskeletal:  Negative for neck pain and neck stiffness.       B/l heel pain  Skin:  Negative for rash.  Neurological:  Negative for dizziness, weakness, numbness and headaches.  Psychiatric/Behavioral:  Positive for agitation and sleep disturbance. Negative for behavioral problems. The patient is nervous/anxious.       Objective:    Physical Exam Vitals reviewed.  Constitutional:      General: He is not in acute distress.    Appearance: He is obese. He is not diaphoretic.  HENT:     Head: Normocephalic and atraumatic.     Nose: Nose normal.     Mouth/Throat:     Mouth: Mucous membranes are moist.  Eyes:     General: No scleral icterus.    Extraocular Movements: Extraocular movements  intact.  Cardiovascular:     Rate and Rhythm: Normal rate and regular rhythm.     Pulses: Normal pulses.     Heart sounds: Normal heart sounds. No murmur heard. Pulmonary:     Breath sounds: Normal breath sounds. No wheezing or rales.  Musculoskeletal:     Cervical back: Neck supple. No tenderness.     Right lower leg: No edema.     Left lower leg: No edema.  Skin:    General: Skin is warm.     Findings: No rash.  Neurological:     General: No focal deficit present.     Mental Status: He is alert and oriented to person, place, and time.  Psychiatric:        Mood and Affect: Mood normal.        Behavior: Behavior normal.    BP (!) 156/92 (BP Location: Left Arm, Patient Position: Sitting, Cuff Size: Normal)   Pulse 72   Temp 98.8 F (37.1 C) (Oral)   Resp 18   Ht _0  (1.88 m)   Wt (!) 327 lb 0.6 oz (148.3 kg)   SpO2 96%   BMI 41.99 kg/m  Wt Readings from Last 3 Encounters:  06/28/21 (!) 327 lb 0.6 oz (148.3 kg)  05/04/21 (!) 331 lb (150.1 kg)  04/26/21 (!) 330 lb 12.8 oz (150 kg)    Health Maintenance Due  Topic Date Due   PNEUMOCOCCAL POLYSACCHARIDE VACCINE AGE 60-64 HIGH RISK  Never done   COVID-19 Vaccine (1) Never done   OPHTHALMOLOGY EXAM  Never done   URINE MICROALBUMIN  Never done   TETANUS/TDAP  Never done   COLONOSCOPY (Pts 45-38yr Insurance coverage will need to be confirmed)  Never done   INFLUENZA VACCINE  06/13/2021    There are no preventive care reminders to display for this patient.   Lab Results  Component Value Date   TSH 0.919 04/26/2021   Lab Results  Component Value Date   WBC 8.1 04/26/2021   HGB 17.7 04/26/2021   HCT 51.5 (H) 04/26/2021   MCV 84 04/26/2021   PLT 276 04/26/2021   Lab Results  Component Value Date   NA 139 04/26/2021   K 4.8 04/26/2021   CO2 23 04/26/2021   GLUCOSE 129 (H) 04/26/2021   BUN 23 04/26/2021   CREATININE 1.70 (H) 04/26/2021   BILITOT 0.4 04/26/2021   ALKPHOS 77 04/26/2021   AST 17 04/26/2021    ALT 27 04/26/2021   PROT 7.7 04/26/2021   ALBUMIN 4.8 04/26/2021   CALCIUM 10.1 04/26/2021   EGFR 49 (L) 04/26/2021   Lab Results  Component Value Date   CHOL  282 (H) 04/26/2021   Lab Results  Component Value Date   HDL 32 (L) 04/26/2021   Lab Results  Component Value Date   LDLCALC 151 (H) 04/26/2021   Lab Results  Component Value Date   TRIG 514 (H) 04/26/2021   Lab Results  Component Value Date   CHOLHDL 8.8 (H) 04/26/2021   Lab Results  Component Value Date   HGBA1C 7.6 (A) 05/04/2021       Assessment & Plan:   Problem List Items Addressed This Visit       Endocrine   Type 2 diabetes mellitus with diabetic neuropathy, without long-term current use of insulin (HCC)    HbA1C: 7.6 Continue Metformin Advised to follow diabetic diet On statin F/u BMP Diabetic eye exam: Advised to follow up with Ophthalmology for diabetic eye exam       Relevant Orders   Basic Metabolic Panel (BMET)     Other   Insomnia - Primary    Has been smoking marijuana for anxiety/insomnia Sleep hygiene material provided Increased Trazodone to 100 mg QD      Relevant Medications   traZODone (DESYREL) 100 MG tablet   Anorgasmia of male    Could be due to Gabapentin, advised to ask Podiatry for alternative He prefers to stay on Gabapentin for now as it has been helping with neuropathic pain        Meds ordered this encounter  Medications   traZODone (DESYREL) 100 MG tablet    Sig: Take 1 tablet (100 mg total) by mouth at bedtime as needed for sleep.    Dispense:  30 tablet    Refill:  3     Luisdavid Hamblin Keith Rake, MD

## 2021-07-13 ENCOUNTER — Encounter: Payer: Self-pay | Admitting: Podiatry

## 2021-07-19 ENCOUNTER — Telehealth: Payer: Self-pay

## 2021-07-19 NOTE — Telephone Encounter (Signed)
Patient calling he states his kidney doctor told him the metformin is having an effect on blood in his urine and to use it only when needed patient is calling questioning what he should do since Dr Allena Katz prescribed it ? Ph# [phone]

## 2021-07-20 ENCOUNTER — Other Ambulatory Visit (HOSPITAL_COMMUNITY): Payer: Self-pay | Admitting: Nephrology

## 2021-07-20 DIAGNOSIS — E1122 Type 2 diabetes mellitus with diabetic chronic kidney disease: Secondary | ICD-10-CM

## 2021-07-20 DIAGNOSIS — R319 Hematuria, unspecified: Secondary | ICD-10-CM

## 2021-07-20 NOTE — Telephone Encounter (Signed)
Patient aware.

## 2021-07-22 ENCOUNTER — Other Ambulatory Visit: Payer: Self-pay | Admitting: Internal Medicine

## 2021-07-22 DIAGNOSIS — G4700 Insomnia, unspecified: Secondary | ICD-10-CM

## 2021-07-22 DIAGNOSIS — E1140 Type 2 diabetes mellitus with diabetic neuropathy, unspecified: Secondary | ICD-10-CM

## 2021-07-25 ENCOUNTER — Other Ambulatory Visit: Payer: Self-pay

## 2021-07-25 ENCOUNTER — Ambulatory Visit (HOSPITAL_COMMUNITY)
Admission: RE | Admit: 2021-07-25 | Discharge: 2021-07-25 | Disposition: A | Discharge status: Home / Self Care | Payer: Medicare Other | Source: Ambulatory Visit | Attending: Nephrology | Admitting: Nephrology

## 2021-07-25 DIAGNOSIS — E1122 Type 2 diabetes mellitus with diabetic chronic kidney disease: Secondary | ICD-10-CM | POA: Diagnosis present

## 2021-07-25 DIAGNOSIS — R319 Hematuria, unspecified: Secondary | ICD-10-CM | POA: Diagnosis present

## 2021-07-27 ENCOUNTER — Ambulatory Visit (INDEPENDENT_AMBULATORY_CARE_PROVIDER_SITE_OTHER): Payer: Medicare Other

## 2021-07-27 ENCOUNTER — Other Ambulatory Visit: Payer: Self-pay

## 2021-07-27 DIAGNOSIS — Z0000 Encounter for general adult medical examination without abnormal findings: Secondary | ICD-10-CM

## 2021-07-27 DIAGNOSIS — E1142 Type 2 diabetes mellitus with diabetic polyneuropathy: Secondary | ICD-10-CM

## 2021-07-27 NOTE — Progress Notes (Signed)
Patient presents to the office today for diabetic shoe and insole measuring.  Patient was measured with brannock device to determine size and width for 1 pair of extra depth shoes and foam casted for 3 pair of insoles.   Documentation of medical necessity will be sent to patient's treating diabetic doctor to verify and sign.   Patient's diabetic provider: Dr. Marcello Moores and insoles will be ordered at that time and patient will be notified for an appointment for fitting when they arrive.   Shoe size (per patient): 13 medium   Brannock measurement: rt: 12.5 medium, lt: 13 medium  Patient shoe selection-   1st choice:   [id number]  2nd choice:  [employer] [address] Highline  Shoe size ordered: 13 medium

## 2021-07-27 NOTE — Progress Notes (Signed)
Subjective:   Francisco Huffman is a 49 y.o. male who presents for an Initial Medicare Annual Wellness Visit. I connected with  Francisco Huffman on 07/27/21 by a audio enabled telemedicine application and verified that I am speaking with the correct person using two identifiers.   I discussed the limitations of evaluation and management by telemedicine. The patient expressed understanding and agreed to proceed.   Location of patient:Home  Location of Provider:Office  Persons participating in virtual visit:Francisco Huffman (patient) and Rhyse [place]  Review of Systems    Defer to PCP       Objective:    Today's Vitals   07/27/21 1552  PainSc: 4    There is no height or weight on file to calculate BMI.  Advanced Directives 07/27/2021 10/24/2019  Does Patient Have a Medical Advance Directive? No No  Would patient like information on creating a medical advance directive? - No - Patient declined    Current Medications (verified) Outpatient Encounter Medications as of 07/27/2021  Medication Sig   Cholecalciferol 125 MCG (5000 UT) capsule Take by mouth.   clobetasol cream (TEMOVATE) 0.05 % Apply 1 application topically 2 (two) times daily.   gabapentin (NEURONTIN) 300 MG capsule Take 1 capsule (300 mg total) by mouth 3 (three) times daily.   meloxicam (MOBIC) 15 MG tablet Take 1 tablet (15 mg total) by mouth daily.   metFORMIN (GLUCOPHAGE) 500 MG tablet TAKE 1 TABLET BY MOUTH 2 TIMES DAILY WITH A MEAL.   rosuvastatin (CRESTOR) 20 MG tablet Take 1 tablet (20 mg total) by mouth daily.   traZODone (DESYREL) 100 MG tablet TAKE 1 TABLET BY MOUTH AT BEDTIME AS NEEDED FOR SLEEP.   No facility-administered encounter medications on file as of 07/27/2021.    Allergies (verified) Patient has no known allergies.   History: Past Medical History:  Diagnosis Date   CKD (chronic kidney disease) stage 3, GFR 30-59 ml/min (HCC)    Diabetes mellitus without complication (HCC)    Diabetic neuropathy  (HCC)    Hyperlipidemia    History reviewed. No pertinent surgical history. History reviewed. No pertinent family history. Social History   Socioeconomic History   Marital status: Divorced    Spouse name: Not on file   Number of children: Not on file   Years of education: Not on file   Highest education level: Not on file  Occupational History   Not on file  Tobacco Use   Smoking status: Never   Smokeless tobacco: Never  Substance and Sexual Activity   Alcohol use: Never   Drug use: Yes    Types: Marijuana    Comment: uses at night   Sexual activity: Not on file  Other Topics Concern   Not on file  Social History Narrative   Not on file   Social Determinants of Health   Financial Resource Strain: Low Risk    Difficulty of Paying Living Expenses: Not very hard  Food Insecurity: No Food Insecurity   Worried About Running Out of Food in the Last Year: Never true   Ran Out of Food in the Last Year: Never true  Transportation Needs: No Transportation Needs   Lack of Transportation (Medical): No   Lack of Transportation (Non-Medical): No  Physical Activity: Inactive   Days of Exercise per Week: 0 days   Minutes of Exercise per Session: 0 min  Stress: Stress Concern Present   Feeling of Stress : To some extent  Social Connections: Socially Isolated  Frequency of Communication with Friends and Family: More than three times a week   Frequency of Social Gatherings with Friends and Family: More than three times a week   Attends Religious Services: Never   Database administrator or Organizations: No   Attends Engineer, structural: Never   Marital Status: Divorced    Tobacco Counseling Counseling given: Not Answered   Clinical Intake:  Pre-visit preparation completed: Yes  Pain : 0-10 Pain Score: 4  Pain Type: Chronic pain Pain Location: Foot Pain Orientation: Left, Right        How often do you need to have someone help you when you read  instructions, pamphlets, or other written materials from your doctor or pharmacy?: 1 - Never What is the last grade level you completed in school?: 12 GRADE  Diabetic?YES Nutrition Risk Assessment:  Has the patient had any N/V/D within the last 2 months?  No  Does the patient have any non-healing wounds?  No  Has the patient had any unintentional weight loss or weight gain?  No   Diabetes:  Is the patient diabetic?  Yes  If diabetic, was a CBG obtained today?  No  Did the patient bring in their glucometer from home?   N/A How often do you monitor your CBG's? NEVER.   Financial Strains and Diabetes Management:  Are you having any financial strains with the device, your supplies or your medication? No .  Does the patient want to be seen by Chronic Care Management for management of their diabetes?  No  Would the patient like to be referred to a Nutritionist or for Diabetic Management?  No   Diabetic Exams:  Diabetic Eye Exam: Overdue for diabetic eye exam. Pt has been advised about the importance in completing this exam. Patient advised to call and schedule an eye exam. Diabetic Foot Exam: Completed 05/04/2021    Interpreter Needed?: No  Information entered by :: Aneka Fagerstrom J,CMA   Activities of Daily Living In your present state of health, do you have any difficulty performing the following activities: 07/27/2021  Hearing? N  Vision? Y  Difficulty concentrating or making decisions? Y  Walking or climbing stairs? N  Dressing or bathing? N  Doing errands, shopping? N  Preparing Food and eating ? N  Using the Toilet? N  In the past six months, have you accidently leaked urine? N  Do you have problems with loss of bowel control? N  Managing your Medications? N  Managing your Finances? N  Housekeeping or managing your Housekeeping? N  Some recent data might be hidden    Patient Care Team: Anabel Halon, MD as PCP - General (Internal Medicine)  Indicate any recent Medical  Services you may have received from other than [facility] providers in the past year (date may be approximate).     Assessment:   This is a routine wellness examination for Jamesmichael.  Hearing/Vision screen No results found.  Dietary issues and exercise activities discussed: Current Exercise Habits: Home exercise routine, Type of exercise: strength training/weights, Time (Minutes): 20, Frequency (Times/Week): 5, Weekly Exercise (Minutes/Week): 100, Intensity: Mild   Goals Addressed   None    Depression Screen PHQ 2/9 Scores 07/27/2021 06/28/2021 05/04/2021 04/26/2021  PHQ - 2 Score 4 3 1 1   PHQ- 9 Score 13 18 - -    Fall Risk Fall Risk  07/27/2021 06/28/2021 05/04/2021 04/26/2021  Falls in the past year? 0 0 0 0  Number falls in past  yr: 0 0 0 0  Injury with Fall? 0 0 0 0  Risk for fall due to : No Fall Risks No Fall Risks No Fall Risks No Fall Risks  Follow up Falls evaluation completed Falls evaluation completed Falls evaluation completed Falls evaluation completed    FALL RISK PREVENTION PERTAINING TO THE HOME:  Any stairs in or around the home? Yes  If so, are there any without handrails? No  Home free of loose throw rugs in walkways, pet beds, electrical cords, etc? Yes  Adequate lighting in your home to reduce risk of falls? Yes   ASSISTIVE DEVICES UTILIZED TO PREVENT FALLS:  Life alert? No  Use of a cane, walker or w/c? No  Grab bars in the bathroom? No  Shower chair or bench in shower? No  Elevated toilet seat or a handicapped toilet? No   TIMED UP AND GO:  Was the test performed?  N/A .  Length of time to ambulate 10 feet: N/A sec.     Cognitive Function:     6CIT Screen 07/27/2021  What Year? 0 points  What month? 0 points  What time? 0 points  Count back from 20 0 points  Months in reverse 0 points  Repeat phrase 0 points  Total Score 0    Immunizations  There is no immunization history on file for this patient.  TDAP status: Due, Education has been  provided regarding the importance of this vaccine. Advised may receive this vaccine at local pharmacy or Health Dept. Aware to provide a copy of the vaccination record if obtained from local pharmacy or Health Dept. Verbalized acceptance and understanding.  Flu Vaccine status: Due, Education has been provided regarding the importance of this vaccine. Advised may receive this vaccine at local pharmacy or Health Dept. Aware to provide a copy of the vaccination record if obtained from local pharmacy or Health Dept. Verbalized acceptance and understanding.  Pneumococcal vaccine status: Due, Education has been provided regarding the importance of this vaccine. Advised may receive this vaccine at local pharmacy or Health Dept. Aware to provide a copy of the vaccination record if obtained from local pharmacy or Health Dept. Verbalized acceptance and understanding.  Covid-19 vaccine status: Information provided on how to obtain vaccines.   Qualifies for Shingles Vaccine? Yes   Zostavax completed No   Shingrix Completed?: No.    Education has been provided regarding the importance of this vaccine. Patient has been advised to call insurance company to determine out of pocket expense if they have not yet received this vaccine. Advised may also receive vaccine at local pharmacy or Health Dept. Verbalized acceptance and understanding.  Screening Tests Health Maintenance  Topic Date Due   COVID-19 Vaccine (1) Never done   PNEUMOCOCCAL POLYSACCHARIDE VACCINE AGE 34-64 HIGH RISK  Never done   OPHTHALMOLOGY EXAM  Never done   URINE MICROALBUMIN  Never done   TETANUS/TDAP  Never done   COLONOSCOPY (Pts 45-97yrs Insurance coverage will need to be confirmed)  Never done   INFLUENZA VACCINE  Never done   Zoster Vaccines- Shingrix (1 of 2) Never done   HEMOGLOBIN A1C  11/03/2021   FOOT EXAM  05/04/2022   Hepatitis C Screening  Completed   HIV Screening  Completed   Pneumococcal Vaccine 1-69 Years old  Aged Out    HPV VACCINES  Aged Out    Health Maintenance  Health Maintenance Due  Topic Date Due   COVID-19 Vaccine (1) Never done  PNEUMOCOCCAL POLYSACCHARIDE VACCINE AGE 44-64 HIGH RISK  Never done   OPHTHALMOLOGY EXAM  Never done   URINE MICROALBUMIN  Never done   TETANUS/TDAP  Never done   COLONOSCOPY (Pts 45-72yrs Insurance coverage will need to be confirmed)  Never done   INFLUENZA VACCINE  Never done   Zoster Vaccines- Shingrix (1 of 2) Never done    Colon Cancer Screening: patient will discuss options with PCP.   Lung Cancer Screening: (Low Dose CT Chest recommended if Age 65-80 years, 30 pack-year currently smoking OR have quit w/in 15years.) does not qualify.   Lung Cancer Screening Referral: no  Additional Screening:  Hepatitis C Screening: does qualify; Completed 04/26/2021  Vision Screening: Recommended annual ophthalmology exams for early detection of glaucoma and other disorders of the eye. Is the patient up to date with their annual eye exam?  No  Who is the provider or what is the name of the office in which the patient attends annual eye exams? N/A If pt is not established with a provider, would they like to be referred to a provider to establish care? No .   Dental Screening: Recommended annual dental exams for proper oral hygiene  Community Resource Referral / Chronic Care Management: CRR required this visit?  No   CCM required this visit?  No      Plan:     I have personally reviewed and noted the following in the patient's chart:   Medical and social history Use of alcohol, tobacco or illicit drugs  Current medications and supplements including opioid prescriptions. Patient is currently taking opioid prescriptions. Information provided to patient regarding non-opioid alternatives. Patient advised to discuss non-opioid treatment plan with their provider. Functional ability and status Nutritional status Physical activity Advanced directives List of other  physicians Hospitalizations, surgeries, and ER visits in previous 12 months Screenings to include cognitive, depression, and falls Referrals and appointments  In addition, I have reviewed and discussed with patient certain preventive protocols, quality metrics, and best practice recommendations. A written personalized care plan for preventive services as well as general preventive health recommendations were provided to patient.     Victorio Palm, [facility]   07/27/2021   Nurse Notes: Non Face to Face 30 minute visit   Mr. Conradt , Thank you for taking time to come for your Medicare Wellness Visit. I appreciate your ongoing commitment to your health goals. Please review the following plan we discussed and let me know if I can assist you in the future.   These are the goals we discussed:  Goals   None     This is a list of the screening recommended for you and due dates:  Health Maintenance  Topic Date Due   COVID-19 Vaccine (1) Never done   Pneumococcal vaccine  Never done   Eye exam for diabetics  Never done   Urine Protein Check  Never done   Tetanus Vaccine  Never done   Colon Cancer Screening  Never done   Flu Shot  Never done   Zoster (Shingles) Vaccine (1 of 2) Never done   Hemoglobin A1C  11/03/2021   Complete foot exam   05/04/2022   Hepatitis C Screening: USPSTF Recommendation to screen - Ages 18-79 yo.  Completed   HIV Screening  Completed   Pneumococcal Vaccination  Aged Out   HPV Vaccine  Aged Out

## 2021-07-27 NOTE — Patient Instructions (Signed)
Health Maintenance, Male Adopting a healthy lifestyle and getting preventive care are important in promoting health and wellness. Ask your health care provider about: The right schedule for you to have regular tests and exams. Things you can do on your own to prevent diseases and keep yourself healthy. What should I know about diet, weight, and exercise? Eat a healthy diet  Eat a diet that includes plenty of vegetables, fruits, low-fat dairy products, and lean protein. Do not eat a lot of foods that are high in solid fats, added sugars, or sodium. Maintain a healthy weight Body mass index (BMI) is a measurement that can be used to identify possible weight problems. It estimates body fat based on height and weight. Your health care provider can help determine your BMI and help you achieve or maintain a healthy weight. Get regular exercise Get regular exercise. This is one of the most important things you can do for your health. Most adults should: Exercise for at least 150 minutes each week. The exercise should increase your heart rate and make you sweat (moderate-intensity exercise). Do strengthening exercises at least twice a week. This is in addition to the moderate-intensity exercise. Spend less time sitting. Even light physical activity can be beneficial. Watch cholesterol and blood lipids Have your blood tested for lipids and cholesterol at 50 years of age, then have this test every 5 years. You may need to have your cholesterol levels checked more often if: Your lipid or cholesterol levels are high. You are older than 50 years of age. You are at high risk for heart disease. What should I know about cancer screening? Many types of cancers can be detected early and may often be prevented. Depending on your health history and family history, you may need to have cancer screening at various ages. This may include screening for: Colorectal cancer. Prostate cancer. Skin cancer. Lung  cancer. What should I know about heart disease, diabetes, and high blood pressure? Blood pressure and heart disease High blood pressure causes heart disease and increases the risk of stroke. This is more likely to develop in people who have high blood pressure readings, are of African descent, or are overweight. Talk with your health care provider about your target blood pressure readings. Have your blood pressure checked: Every 3-5 years if you are 18-39 years of age. Every year if you are 40 years old or older. If you are between the ages of 65 and 75 and are a current or former smoker, ask your health care provider if you should have a one-time screening for abdominal aortic aneurysm (AAA). Diabetes Have regular diabetes screenings. This checks your fasting blood sugar level. Have the screening done: Once every three years after age 45 if you are at a normal weight and have a low risk for diabetes. More often and at a younger age if you are overweight or have a high risk for diabetes. What should I know about preventing infection? Hepatitis B If you have a higher risk for hepatitis B, you should be screened for this virus. Talk with your health care provider to find out if you are at risk for hepatitis B infection. Hepatitis C Blood testing is recommended for: Everyone born from 1945 through 1965. Anyone with known risk factors for hepatitis C. Sexually transmitted infections (STIs) You should be screened each year for STIs, including gonorrhea and chlamydia, if: You are sexually active and are younger than 50 years of age. You are older than 50 years   of age and your health care provider tells you that you are at risk for this type of infection. Your sexual activity has changed since you were last screened, and you are at increased risk for chlamydia or gonorrhea. Ask your health care provider if you are at risk. Ask your health care provider about whether you are at high risk for HIV.  Your health care provider may recommend a prescription medicine to help prevent HIV infection. If you choose to take medicine to prevent HIV, you should first get tested for HIV. You should then be tested every 3 months for as long as you are taking the medicine. Follow these instructions at home: Lifestyle Do not use any products that contain nicotine or tobacco, such as cigarettes, e-cigarettes, and chewing tobacco. If you need help quitting, ask your health care provider. Do not use street drugs. Do not share needles. Ask your health care provider for help if you need support or information about quitting drugs. Alcohol use Do not drink alcohol if your health care provider tells you not to drink. If you drink alcohol: Limit how much you have to 0-2 drinks a day. Be aware of how much alcohol is in your drink. In the [place] one drink equals one 12 oz bottle of beer (355 mL), one 5 oz glass of wine (148 mL), or one 1 oz glass of hard liquor (44 mL). General instructions Schedule regular health, dental, and eye exams. Stay current with your vaccines. Tell your health care provider if: You often feel depressed. You have ever been abused or do not feel safe at home. Summary Adopting a healthy lifestyle and getting preventive care are important in promoting health and wellness. Follow your health care provider's instructions about healthy diet, exercising, and getting tested or screened for diseases. Follow your health care provider's instructions on monitoring your cholesterol and blood pressure. This information is not intended to replace advice given to you by your health care provider. Make sure you discuss any questions you have with your health care provider. Document Revised: 01/07/2021 Document Reviewed: 10/23/2018 Elsevier Patient Education  2022 [employer]  

## 2021-08-03 ENCOUNTER — Ambulatory Visit (INDEPENDENT_AMBULATORY_CARE_PROVIDER_SITE_OTHER): Payer: Medicare Other | Admitting: Podiatry

## 2021-08-03 ENCOUNTER — Encounter: Payer: Self-pay | Admitting: Podiatry

## 2021-08-03 ENCOUNTER — Other Ambulatory Visit: Payer: Self-pay

## 2021-08-03 DIAGNOSIS — Z79899 Other long term (current) drug therapy: Secondary | ICD-10-CM | POA: Diagnosis not present

## 2021-08-03 DIAGNOSIS — L603 Nail dystrophy: Secondary | ICD-10-CM

## 2021-08-03 NOTE — Progress Notes (Signed)
He presents today for follow-up of his nail pathology.  Objective: Nail pathology demonstrates onychomycosis and nail dystrophy.  Assessment: Nail dystrophy onychomycosis.  Plan: Discussed in great detail today topical therapy oral therapy laser therapy he declined all of these therapies will notify me when and if he wants to treat this.

## 2021-08-04 ENCOUNTER — Encounter: Payer: Self-pay | Admitting: Internal Medicine

## 2021-08-04 ENCOUNTER — Ambulatory Visit (INDEPENDENT_AMBULATORY_CARE_PROVIDER_SITE_OTHER): Payer: Medicare Other | Admitting: Internal Medicine

## 2021-08-04 VITALS — BP 162/96 | HR 69 | Temp 98.5°F | Resp 18 | Ht 74.0 in | Wt 322.1 lb

## 2021-08-04 DIAGNOSIS — E1140 Type 2 diabetes mellitus with diabetic neuropathy, unspecified: Secondary | ICD-10-CM | POA: Diagnosis not present

## 2021-08-04 DIAGNOSIS — G4700 Insomnia, unspecified: Secondary | ICD-10-CM | POA: Diagnosis not present

## 2021-08-04 DIAGNOSIS — N1831 Chronic kidney disease, stage 3a: Secondary | ICD-10-CM | POA: Diagnosis not present

## 2021-08-04 DIAGNOSIS — I10 Essential (primary) hypertension: Secondary | ICD-10-CM

## 2021-08-04 DIAGNOSIS — L0882 Omphalitis not of newborn: Secondary | ICD-10-CM

## 2021-08-04 LAB — POCT GLYCOSYLATED HEMOGLOBIN (HGB A1C): HbA1c, POC (controlled diabetic range): 6.9 % (ref 0.0–7.0)

## 2021-08-04 MED ORDER — LOSARTAN POTASSIUM 50 MG PO TABS: 50.0000 mg | ORAL_TABLET | Freq: Every day | ORAL | 0 refills | Status: DC

## 2021-08-04 MED ORDER — EMPAGLIFLOZIN 10 MG PO TABS: 10.0000 mg | ORAL_TABLET | Freq: Every day | ORAL | 2 refills | Status: DC

## 2021-08-04 MED ORDER — CLOBETASOL PROPIONATE 0.05 % EX CREA: 1.0000 "application " | TOPICAL_CREAM | Freq: Two times a day (BID) | CUTANEOUS | 0 refills | Status: DC

## 2021-08-04 MED ORDER — AMITRIPTYLINE HCL 10 MG PO TABS: 10.0000 mg | ORAL_TABLET | Freq: Every day | ORAL | 2 refills | Status: DC

## 2021-08-04 NOTE — Progress Notes (Signed)
Established Patient Office Visit  Subjective:  Patient ID: Francisco Huffman, male    DOB: May 07, 1971  Age: 50 y.o. MRN: [id number]  CC:  Chief Complaint  Patient presents with   Follow-up    3 month follow trazodone is not working still waking up 3-4 times a night needs medication that was called in for belly button previously    HPI Francisco Huffman  is a 50 year old male with PMH of DM with neuropathy, HLD, CKD stage 3a, PTSD, insomnia and morbid obesity who presents for follow up of his chronic medical conditions.  HTN: His BP was elevated in the office today.  He has had elevated BP readings in the past as well.  He denies any headache, dizziness, chest pain, dyspnea or palpitations.  Type II DM: He is Hgb A1c has improved to 6.9 now.  He has been taking metformin.  He has been following low-carb diet.  He is seeing nephrology for CKD stage IIIa.  He denies any polyuria or polyphagia.  Insomnia: He continues to have insomnia despite taking trazodone 100 mg at bedtime.  He reports anxiety at nighttime.  Denies any anhedonia, SI or HI.  He complains of discharge and itching around the umbilicus.  Denies any bleeding from the area.   Past Medical History:  Diagnosis Date   CKD (chronic kidney disease) stage 3, GFR 30-59 ml/min (HCC)    Diabetes mellitus without complication (HCC)    Diabetic neuropathy ([place])    Hyperlipidemia     History reviewed. No pertinent surgical history.  History reviewed. No pertinent family history.  Social History   Socioeconomic History   Marital status: Divorced    Spouse name: Not on file   Number of children: Not on file   Years of education: Not on file   Highest education level: Not on file  Occupational History   Not on file  Tobacco Use   Smoking status: Never   Smokeless tobacco: Never  Substance and Sexual Activity   Alcohol use: Never   Drug use: Yes    Types: Marijuana    Comment: uses at night   Sexual activity: Not on file   Other Topics Concern   Not on file  Social History Narrative   Not on file   Social Determinants of Health   Financial Resource Strain: Low Risk    Difficulty of Paying Living Expenses: Not very hard  Food Insecurity: No Food Insecurity   Worried About Running Out of Food in the Last Year: Never true   [place] in the Last Year: Never true  Transportation Needs: No Transportation Needs   Lack of Transportation (Medical): No   Lack of Transportation (Non-Medical): No  Physical Activity: Inactive   Days of Exercise per Week: 0 days   Minutes of Exercise per Session: 0 min  Stress: Stress Concern Present   Feeling of Stress : To some extent  Social Connections: Socially Isolated   Frequency of Communication with Friends and Family: More than three times a week   Frequency of Social Gatherings with Friends and Family: More than three times a week   Attends Religious Services: Never   Marine scientist or Organizations: No   Attends Archivist Meetings: Never   Marital Status: Divorced  Human resources officer Violence: Not At Risk   Fear of Current or Ex-Partner: No   Emotionally Abused: No   Physically Abused: No   Sexually Abused: No  Outpatient Medications Prior to Visit  Medication Sig Dispense Refill   Cholecalciferol 125 MCG (5000 UT) capsule Take by mouth.     gabapentin (NEURONTIN) 300 MG capsule Take 1 capsule (300 mg total) by mouth 3 (three) times daily. 90 capsule 3   meloxicam (MOBIC) 15 MG tablet Take 1 tablet (15 mg total) by mouth daily. 30 tablet 3   rosuvastatin (CRESTOR) 20 MG tablet Take 1 tablet (20 mg total) by mouth daily. 90 tablet 1   traZODone (DESYREL) 100 MG tablet TAKE 1 TABLET BY MOUTH AT BEDTIME AS NEEDED FOR SLEEP. 90 tablet 2   clobetasol cream (TEMOVATE) 4.08 % Apply 1 application topically 2 (two) times daily. 30 g 0   metFORMIN (GLUCOPHAGE) 500 MG tablet TAKE 1 TABLET BY MOUTH 2 TIMES DAILY WITH A MEAL. 60 tablet 2   No  facility-administered medications prior to visit.    No Known Allergies  ROS Review of Systems  Constitutional:  Negative for chills and fever.  HENT:  Negative for congestion and sore throat.   Eyes:  Negative for pain and discharge.  Respiratory:  Negative for cough and shortness of breath.   Cardiovascular:  Negative for chest pain and palpitations.  Gastrointestinal:  Negative for constipation, diarrhea, nausea and vomiting.  Endocrine: Negative for polydipsia and polyuria.  Genitourinary:  Negative for dysuria and hematuria.  Musculoskeletal:  Negative for neck pain and neck stiffness.       B/l heel pain  Skin:  Negative for rash.  Neurological:  Negative for dizziness, weakness, numbness and headaches.  Psychiatric/Behavioral:  Positive for agitation, dysphoric mood and sleep disturbance. Negative for behavioral problems. The patient is nervous/anxious.      Objective:    Physical Exam Vitals reviewed.  Constitutional:      General: He is not in acute distress.    Appearance: He is obese. He is not diaphoretic.  HENT:     Head: Normocephalic and atraumatic.     Nose: Nose normal.     Mouth/Throat:     Mouth: Mucous membranes are moist.  Eyes:     General: No scleral icterus.    Extraocular Movements: Extraocular movements intact.  Cardiovascular:     Rate and Rhythm: Normal rate and regular rhythm.     Pulses: Normal pulses.     Heart sounds: Normal heart sounds. No murmur heard. Pulmonary:     Breath sounds: Normal breath sounds. No wheezing or rales.  Abdominal:     Palpations: Abdomen is soft.     Tenderness: There is no abdominal tenderness.     Comments: Scant serous discharge from umbilicus  Musculoskeletal:     Cervical back: Neck supple. No tenderness.     Right lower leg: No edema.     Left lower leg: No edema.  Skin:    General: Skin is warm.     Findings: No rash.  Neurological:     General: No focal deficit present.     Mental Status: He is  alert and oriented to person, place, and time.  Psychiatric:        Mood and Affect: Mood normal.        Behavior: Behavior normal.    BP (!) 162/96 (BP Location: Left Arm, Patient Position: Sitting, Cuff Size: Normal)   Pulse 69   Temp 98.5 F (36.9 C) (Oral)   Resp 18   Ht _0  (1.88 m)   Wt (!) 322 lb 1.3 oz (146.1 kg)  SpO2 96%   BMI 41.35 kg/m  Wt Readings from Last 3 Encounters:  08/04/21 (!) 322 lb 1.3 oz (146.1 kg)  06/28/21 (!) 327 lb 0.6 oz (148.3 kg)  05/04/21 (!) 331 lb (150.1 kg)     Health Maintenance Due  Topic Date Due   COVID-19 Vaccine (1) Never done   OPHTHALMOLOGY EXAM  Never done   URINE MICROALBUMIN  Never done   TETANUS/TDAP  Never done   Zoster Vaccines- Shingrix (1 of 2) Never done   COLONOSCOPY (Pts 45-71yr Insurance coverage will need to be confirmed)  Never done   INFLUENZA VACCINE  Never done    There are no preventive care reminders to display for this patient.  Lab Results  Component Value Date   TSH 0.919 04/26/2021   Lab Results  Component Value Date   WBC 8.1 04/26/2021   HGB 17.7 04/26/2021   HCT 51.5 (H) 04/26/2021   MCV 84 04/26/2021   PLT 276 04/26/2021   Lab Results  Component Value Date   NA 139 06/28/2021   K 4.9 06/28/2021   CO2 24 06/28/2021   GLUCOSE 199 (H) 06/28/2021   BUN 24 06/28/2021   CREATININE 1.59 (H) 06/28/2021   BILITOT 0.4 04/26/2021   ALKPHOS 77 04/26/2021   AST 17 04/26/2021   ALT 27 04/26/2021   PROT 7.7 04/26/2021   ALBUMIN 4.8 04/26/2021   CALCIUM 9.9 06/28/2021   EGFR 53 (L) 06/28/2021   Lab Results  Component Value Date   CHOL 282 (H) 04/26/2021   Lab Results  Component Value Date   HDL 32 (L) 04/26/2021   Lab Results  Component Value Date   LDLCALC 151 (H) 04/26/2021   Lab Results  Component Value Date   TRIG 514 (H) 04/26/2021   Lab Results  Component Value Date   CHOLHDL 8.8 (H) 04/26/2021   Lab Results  Component Value Date   HGBA1C 6.9 08/04/2021       Assessment & Plan:   Problem List Items Addressed This Visit       Cardiovascular and Mediastinum   Essential hypertension    BP Readings from Last 1 Encounters:  08/04/21 (!) 162/96  Uncontrolled, started Losartan 50 mg QD Counseled for compliance with the medications Advised DASH diet and moderate exercise/walking, at least 150 mins/week       Relevant Medications   losartan (COZAAR) 50 MG tablet     Endocrine   Type 2 diabetes mellitus with diabetic neuropathy, without long-term current use of insulin (HCC) - Primary    HbA1C: 6.9 Discontinue Metformin as he has CKD, switched to [place] follow diabetic diet On statin Diabetic eye exam: Advised to follow up with Ophthalmology for diabetic eye exam       Relevant Medications   losartan (COZAAR) 50 MG tablet   empagliflozin (JARDIANCE) 10 MG TABS tablet   Other Relevant Orders   POCT glycosylated hemoglobin (Hb A1C) (Completed)     Genitourinary   Stage 3b chronic kidney disease ([place]    Likely due to underlying DM and HTN Followed by Nephrology Avoid nephrotoxic agents Started Jardiance        Other   Insomnia    Has been smoking marijuana for anxiety/insomnia Sleep hygiene material provided Continue Trazodone to 100 mg QD Started Elavil 10 mg qHS      Relevant Medications   amitriptyline (ELAVIL) 10 MG tablet   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    Dispense:  30 g    Refill:  0   amitriptyline (ELAVIL) 10 MG tablet    Sig: Take 1 tablet (10 mg total) by mouth at bedtime.    Dispense:  30 tablet    Refill:  2   losartan (COZAAR) 50 MG tablet    Sig: Take 1 tablet (50 mg total) by mouth  daily.    Dispense:  90 tablet    Refill:  0   empagliflozin (JARDIANCE) 10 MG TABS tablet    Sig: Take 1 tablet (10 mg total) by mouth daily before breakfast.    Dispense:  30 tablet    Refill:  2    Follow-up: Return in about 3 months (around 11/03/2021) for HTN and DM.    Lindell Spar, MD

## 2021-08-04 NOTE — Assessment & Plan Note (Signed)
HbA1C: 6.9 Discontinue Metformin as he has CKD, switched to Jardiance Advised to follow diabetic diet On statin Diabetic eye exam: Advised to follow up with Ophthalmology for diabetic eye exam

## 2021-08-04 NOTE — Assessment & Plan Note (Signed)
Serous/mucoid with intermittent blood tinged discharge from umbilical area Clobetasol cream prescribed 

## 2021-08-04 NOTE — Assessment & Plan Note (Signed)
Has been smoking marijuana for anxiety/insomnia Sleep hygiene material provided Continue Trazodone to 100 mg QD Started Elavil 10 mg qHS

## 2021-08-04 NOTE — Assessment & Plan Note (Signed)
BP Readings from Last 1 Encounters:  08/04/21 (!) 162/96   Uncontrolled, started Losartan 50 mg QD Counseled for compliance with the medications Advised DASH diet and moderate exercise/walking, at least 150 mins/week

## 2021-08-04 NOTE — Assessment & Plan Note (Signed)
Likely due to underlying DM and HTN Followed by Nephrology Avoid nephrotoxic agents Started [place]

## 2021-08-04 NOTE — Patient Instructions (Signed)
Please start taking Jardiance as prescribed. Please stop taking Metformin.  Please start taking Amitriptyline for insomnia.  Please continue to follow low carb and low salt diet and perform moderate exercise/walking at least 150 mins/week.

## 2021-08-19 ENCOUNTER — Other Ambulatory Visit: Payer: Self-pay | Admitting: Podiatry

## 2021-08-26 ENCOUNTER — Other Ambulatory Visit: Payer: Self-pay | Admitting: Internal Medicine

## 2021-08-26 DIAGNOSIS — G4700 Insomnia, unspecified: Secondary | ICD-10-CM

## 2021-09-08 ENCOUNTER — Encounter (INDEPENDENT_AMBULATORY_CARE_PROVIDER_SITE_OTHER): Payer: Self-pay

## 2021-09-08 ENCOUNTER — Other Ambulatory Visit: Payer: Self-pay

## 2021-09-08 ENCOUNTER — Ambulatory Visit: Payer: Medicare Other

## 2021-09-08 LAB — HM DIABETES EYE EXAM

## 2021-09-14 ENCOUNTER — Encounter: Payer: Self-pay | Admitting: *Deleted

## 2021-09-14 ENCOUNTER — Telehealth: Payer: Self-pay | Admitting: Internal Medicine

## 2021-09-14 NOTE — Telephone Encounter (Signed)
Virtual Appt made

## 2021-09-14 NOTE — Telephone Encounter (Signed)
Pt is calling he states that one of the medication has made him constipated. He has tried prune juice, and other things, and nothing is working.  He is wanting to know what he can do Naturally to get his bowels moving

## 2021-09-15 ENCOUNTER — Encounter (INDEPENDENT_AMBULATORY_CARE_PROVIDER_SITE_OTHER): Payer: Self-pay

## 2021-09-15 ENCOUNTER — Ambulatory Visit (INDEPENDENT_AMBULATORY_CARE_PROVIDER_SITE_OTHER): Payer: Medicare Other | Admitting: Internal Medicine

## 2021-09-15 ENCOUNTER — Encounter: Payer: Self-pay | Admitting: Internal Medicine

## 2021-09-15 ENCOUNTER — Other Ambulatory Visit: Payer: Self-pay

## 2021-09-15 DIAGNOSIS — G4700 Insomnia, unspecified: Secondary | ICD-10-CM | POA: Diagnosis not present

## 2021-09-15 DIAGNOSIS — N522 Drug-induced erectile dysfunction: Secondary | ICD-10-CM | POA: Insufficient documentation

## 2021-09-15 DIAGNOSIS — K5903 Drug induced constipation: Secondary | ICD-10-CM | POA: Insufficient documentation

## 2021-09-15 DIAGNOSIS — K5904 Chronic idiopathic constipation: Secondary | ICD-10-CM | POA: Insufficient documentation

## 2021-09-15 MED ORDER — POLYETHYLENE GLYCOL 3350 17 GM/SCOOP PO POWD
17.0000 g | Freq: Two times a day (BID) | ORAL | 1 refills | Status: DC | PRN
Start: 2021-09-15 — End: 2022-05-17

## 2021-09-15 MED ORDER — SILDENAFIL CITRATE 25 MG PO TABS: 25.0000 mg | ORAL_TABLET | Freq: Every day | ORAL | 0 refills | Status: DC | PRN

## 2021-09-15 MED ORDER — TRAZODONE HCL 150 MG PO TABS: 150.0000 mg | ORAL_TABLET | Freq: Every evening | ORAL | 2 refills | Status: DC | PRN

## 2021-09-15 NOTE — Assessment & Plan Note (Signed)
Could be worse due to Elavil -discontinued Advised to take Colace as needed Started MiraLAX as needed Maintain adequate hydration Continue to take prune juice for constipation

## 2021-09-15 NOTE — Progress Notes (Signed)
Virtual Visit via Telephone Note   This visit type was conducted due to national recommendations for restrictions regarding the COVID-19 Pandemic (e.g. social distancing) in an effort to limit this patient's exposure and mitigate transmission in our community.  Due to his co-morbid illnesses, this patient is at least at moderate risk for complications without adequate follow up.  This format is felt to be most appropriate for this patient at this time.  The patient did not have access to video technology/had technical difficulties with video requiring transitioning to audio format only (telephone).  All issues noted in this document were discussed and addressed.  No physical exam could be performed with this format.  Evaluation Performed:  Follow-up visit  Date:  09/15/2021   ID:  Francisco Huffman, DOB Aug 11, 1971, MRN [id number]  Patient Location: Home Provider Location: Office/Clinic  Participants: Patient Location of Patient: Home Location of Provider: Telehealth Consent was obtain for visit to be over via telehealth. I verified that I am speaking with the correct person using two identifiers.  PCP:  Anabel Halon, MD   Chief Complaint: Constipation, insomnia  History of Present Illness:    Francisco Huffman is a 50 y.o. male who has a televisit for complaint of constipation for last 2 weeks.  He has worse constipation since starting Elavil.  Denies any melena or hematochezia.  He still complains of insomnia despite taking trazodone and Elavil.  He has tried taking trazodone 150 mg, but wakes up after 4 hours of sleep.  He admits that he watches TV once he wakes up.  Denies any anhedonia, SI or HI.  He complains of erectile dysfunction since starting medications. He was not taking any medications few months ago.  The patient does not have symptoms concerning for COVID-19 infection (fever, chills, cough, or new shortness of breath).   Past Medical, Surgical, Social History,  Allergies, and Medications have been Reviewed.  Past Medical History:  Diagnosis Date   CKD (chronic kidney disease) stage 3, GFR 30-59 ml/min (HCC)    Diabetes mellitus without complication (HCC)    Diabetic neuropathy (HCC)    Hyperlipidemia    History reviewed. No pertinent surgical history.   Current Meds  Medication Sig   Cholecalciferol 125 MCG (5000 UT) capsule Take by mouth.   clobetasol cream (TEMOVATE) 0.05 % Apply 1 application topically 2 (two) times daily.   empagliflozin (JARDIANCE) 10 MG TABS tablet Take 1 tablet (10 mg total) by mouth daily before breakfast.   gabapentin (NEURONTIN) 300 MG capsule Take 1 capsule (300 mg total) by mouth 3 (three) times daily.   losartan (COZAAR) 50 MG tablet Take 1 tablet (50 mg total) by mouth daily.   meloxicam (MOBIC) 15 MG tablet TAKE 1 TABLET (15 MG TOTAL) BY MOUTH DAILY.   polyethylene glycol powder (GLYCOLAX/MIRALAX) 17 GM/SCOOP powder Take 17 g by mouth 2 (two) times daily as needed for moderate constipation.   rosuvastatin (CRESTOR) 20 MG tablet Take 1 tablet (20 mg total) by mouth daily.   sildenafil (VIAGRA) 25 MG tablet Take 1 tablet (25 mg total) by mouth daily as needed for erectile dysfunction.   [DISCONTINUED] amitriptyline (ELAVIL) 10 MG tablet TAKE 1 TABLET BY MOUTH EVERYDAY AT BEDTIME   [DISCONTINUED] traZODone (DESYREL) 100 MG tablet TAKE 1 TABLET BY MOUTH AT BEDTIME AS NEEDED FOR SLEEP.     Allergies:   Patient has no known allergies.   ROS:   Please see the history of present illness.  All other systems reviewed and are negative.   Labs/Other Tests and Data Reviewed:    Recent Labs: 04/26/2021: ALT 27; Hemoglobin 17.7; Platelets 276; TSH 0.919 06/28/2021: BUN 24; Creatinine, Ser 1.59; Potassium 4.9; Sodium 139   Recent Lipid Panel Lab Results  Component Value Date/Time   CHOL 282 (H) 04/26/2021 03:32 PM   TRIG 514 (H) 04/26/2021 03:32 PM   HDL 32 (L) 04/26/2021 03:32 PM   CHOLHDL 8.8 (H) 04/26/2021  03:32 PM   LDLCALC 151 (H) 04/26/2021 03:32 PM    Wt Readings from Last 3 Encounters:  08/04/21 (!) 322 lb 1.3 oz (146.1 kg)  06/28/21 (!) 327 lb 0.6 oz (148.3 kg)  05/04/21 (!) 331 lb (150.1 kg)     ASSESSMENT & PLAN:    Insomnia Has been smoking marijuana for anxiety/insomnia Sleep hygiene material provided Increased Trazodone to 150 mg qHS DC Elavil due to worsening of constipation and sluggishness during the day  Drug-induced erectile dysfunction Due to multiple medications started recently DC Elavil Sildenafil PRN  Drug-induced constipation Could be worse due to Elavil -discontinued Advised to take Colace as needed Started MiraLAX as needed Maintain adequate hydration Continue to take prune juice for constipation   Time:   Today, I have spent 18 minutes reviewing the chart, including problem list, medications, and with the patient with telehealth technology discussing the above problems.   Medication Adjustments/Labs and Tests Ordered: Current medicines are reviewed at length with the patient today.  Concerns regarding medicines are outlined above.   Tests Ordered: No orders of the defined types were placed in this encounter.   Medication Changes: Meds ordered this encounter  Medications   polyethylene glycol powder (GLYCOLAX/MIRALAX) 17 GM/SCOOP powder    Sig: Take 17 g by mouth 2 (two) times daily as needed for moderate constipation.    Dispense:  3350 g    Refill:  1   traZODone (DESYREL) 150 MG tablet    Sig: Take 1 tablet (150 mg total) by mouth at bedtime as needed. for sleep    Dispense:  30 tablet    Refill:  2   sildenafil (VIAGRA) 25 MG tablet    Sig: Take 1 tablet (25 mg total) by mouth daily as needed for erectile dysfunction.    Dispense:  10 tablet    Refill:  0     Note: This dictation was prepared with Dragon dictation along with smaller phrase technology. Similar sounding words can be transcribed inadequately or may not be corrected  upon review. Any transcriptional errors that result from this process are unintentional.      Disposition:  Follow up  Signed, Lindell Spar, MD  09/15/2021 5:02 PM     [facility]

## 2021-09-15 NOTE — Assessment & Plan Note (Signed)
Due to multiple medications started recently DC Elavil Sildenafil PRN

## 2021-09-15 NOTE — Patient Instructions (Signed)
Please take Colace as needed for constipation.  Okay to use MiraLAX for moderate constipation.  Maintain adequate hydration.  Please stop taking Elavil.  Start taking trazodone 150 mg instead of 100 mg at nighttime.  Please maintain simple sleep hygiene. - Maintain dark and non-noisy environment in the bedroom. - Please use the bedroom for sleep and sexual activity only. - Do not use electronic devices in the bedroom. - Please take dinner at least 2 hours before bedtime. - Please avoid caffeinated products in the evening, including coffee, soft drinks. - Please try to maintain the regular sleep-wake cycle - Go to bed and wake up at the same time.

## 2021-09-15 NOTE — Assessment & Plan Note (Signed)
Has been smoking marijuana for anxiety/insomnia Sleep hygiene material provided Increased Trazodone to 150 mg qHS DC Elavil due to worsening of constipation and sluggishness during the day

## 2021-09-16 ENCOUNTER — Other Ambulatory Visit: Payer: Self-pay | Admitting: Internal Medicine

## 2021-09-16 DIAGNOSIS — E782 Mixed hyperlipidemia: Secondary | ICD-10-CM

## 2021-09-22 ENCOUNTER — Telehealth: Payer: Self-pay | Admitting: Internal Medicine

## 2021-09-22 NOTE — Telephone Encounter (Signed)
Pt called in about results from eye exam. Pt states he hasn't heard anything from anyone.

## 2021-09-22 NOTE — Telephone Encounter (Signed)
Pt notified DM screening was negative with verbal understanding

## 2021-09-23 ENCOUNTER — Telehealth: Payer: Self-pay | Admitting: Internal Medicine

## 2021-09-23 ENCOUNTER — Other Ambulatory Visit: Payer: Self-pay | Admitting: Podiatry

## 2021-09-23 NOTE — Telephone Encounter (Signed)
Medication is working --please send in script  Sildenafil ---Please call into [facility] --In [place]

## 2021-09-26 ENCOUNTER — Other Ambulatory Visit: Payer: Self-pay | Admitting: Internal Medicine

## 2021-09-26 ENCOUNTER — Telehealth: Payer: Self-pay | Admitting: *Deleted

## 2021-09-26 DIAGNOSIS — N522 Drug-induced erectile dysfunction: Secondary | ICD-10-CM

## 2021-09-26 MED ORDER — SILDENAFIL CITRATE 25 MG PO TABS: 25.0000 mg | ORAL_TABLET | Freq: Every day | ORAL | 2 refills | Status: DC | PRN

## 2021-09-26 MED ORDER — GABAPENTIN 300 MG PO CAPS: 300.0000 mg | ORAL_CAPSULE | Freq: Three times a day (TID) | ORAL | 3 refills | Status: DC

## 2021-09-26 NOTE — Telephone Encounter (Signed)
Sent over corrected Rx to the pharmacy

## 2021-09-26 NOTE — Telephone Encounter (Signed)
"  Dr. Maren Beach a refill of my Gabapentin.  He had taking it three times a day.  The prescription he sent over the weekend was for only twice a day.  If I take it three times a day, I'm going to run out.  He had changed it before.  Can you let him know this?"  I will let Dr. Al Corpus and his assistant know.

## 2021-09-26 NOTE — Addendum Note (Signed)
Addended by: Lottie Rater E on: 09/26/2021 01:20 PM   Modules accepted: Orders

## 2021-10-08 ENCOUNTER — Other Ambulatory Visit: Payer: Self-pay | Admitting: Internal Medicine

## 2021-10-08 DIAGNOSIS — G4700 Insomnia, unspecified: Secondary | ICD-10-CM

## 2021-10-13 ENCOUNTER — Telehealth: Payer: Self-pay | Admitting: Internal Medicine

## 2021-10-13 NOTE — Telephone Encounter (Signed)
Called and spoke with patient, he is aware ok to take OTC allergy medication

## 2021-10-13 NOTE — Telephone Encounter (Signed)
Pt called in for advise on if he could take over the counter allergy meds with his current prescribed meds    [place]

## 2021-10-19 ENCOUNTER — Other Ambulatory Visit (HOSPITAL_COMMUNITY)
Admission: RE | Admit: 2021-10-19 | Discharge: 2021-10-19 | Disposition: A | Discharge status: Home / Self Care | Payer: Medicare Other | Source: Ambulatory Visit | Attending: Nephrology | Admitting: Nephrology

## 2021-10-19 DIAGNOSIS — N189 Chronic kidney disease, unspecified: Secondary | ICD-10-CM | POA: Insufficient documentation

## 2021-10-19 DIAGNOSIS — I129 Hypertensive chronic kidney disease with stage 1 through stage 4 chronic kidney disease, or unspecified chronic kidney disease: Secondary | ICD-10-CM | POA: Insufficient documentation

## 2021-10-19 DIAGNOSIS — R82991 Hypocitraturia: Secondary | ICD-10-CM | POA: Insufficient documentation

## 2021-10-19 DIAGNOSIS — R319 Hematuria, unspecified: Secondary | ICD-10-CM | POA: Insufficient documentation

## 2021-10-19 DIAGNOSIS — N200 Calculus of kidney: Secondary | ICD-10-CM | POA: Insufficient documentation

## 2021-10-19 DIAGNOSIS — R809 Proteinuria, unspecified: Secondary | ICD-10-CM | POA: Insufficient documentation

## 2021-10-19 DIAGNOSIS — E1122 Type 2 diabetes mellitus with diabetic chronic kidney disease: Secondary | ICD-10-CM | POA: Insufficient documentation

## 2021-10-19 DIAGNOSIS — R82992 Hyperoxaluria: Secondary | ICD-10-CM | POA: Diagnosis present

## 2021-10-19 DIAGNOSIS — E1129 Type 2 diabetes mellitus with other diabetic kidney complication: Secondary | ICD-10-CM | POA: Insufficient documentation

## 2021-10-19 DIAGNOSIS — R82994 Hypercalciuria: Secondary | ICD-10-CM | POA: Insufficient documentation

## 2021-10-19 LAB — CBC
HCT: 50 % (ref 39.0–52.0)
Hemoglobin: 17.2 g/dL — ABNORMAL HIGH (ref 13.0–17.0)
MCH: 28.6 pg (ref 26.0–34.0)
MCHC: 34.4 g/dL (ref 30.0–36.0)
MCV: 83.1 fL (ref 80.0–100.0)
Platelets: 278 10*3/uL (ref 150–400)
RBC: 6.02 MIL/uL — ABNORMAL HIGH (ref 4.22–5.81)
RDW: 12.6 % (ref 11.5–15.5)
WBC: 9.1 10*3/uL (ref 4.0–10.5)
nRBC: 0 % (ref 0.0–0.2)

## 2021-10-19 LAB — RENAL FUNCTION PANEL
Albumin: 4.4 g/dL (ref 3.5–5.0)
Anion gap: 10 (ref 5–15)
BUN: 26 mg/dL — ABNORMAL HIGH (ref 6–20)
CO2: 29 mmol/L (ref 22–32)
Calcium: 9.5 mg/dL (ref 8.9–10.3)
Chloride: 98 mmol/L (ref 98–111)
Creatinine, Ser: 1.72 mg/dL — ABNORMAL HIGH (ref 0.61–1.24)
GFR, Estimated: 48 mL/min — ABNORMAL LOW (ref >60)
Glucose, Bld: 250 mg/dL — ABNORMAL HIGH (ref 70–99)
Phosphorus: 4.3 mg/dL (ref 2.5–4.6)
Potassium: 4.1 mmol/L (ref 3.5–5.1)
Sodium: 137 mmol/L (ref 135–145)

## 2021-10-19 LAB — PROTEIN / CREATININE RATIO, URINE
Creatinine, Urine: 72.7 mg/dL
Total Protein, Urine: <6 mg/dL

## 2021-10-21 ENCOUNTER — Other Ambulatory Visit: Payer: Self-pay | Admitting: Internal Medicine

## 2021-10-21 DIAGNOSIS — E1140 Type 2 diabetes mellitus with diabetic neuropathy, unspecified: Secondary | ICD-10-CM

## 2021-10-22 ENCOUNTER — Other Ambulatory Visit: Payer: Self-pay | Admitting: Internal Medicine

## 2021-10-22 DIAGNOSIS — E1140 Type 2 diabetes mellitus with diabetic neuropathy, unspecified: Secondary | ICD-10-CM

## 2021-10-25 ENCOUNTER — Telehealth: Payer: Self-pay | Admitting: Podiatry

## 2021-10-25 NOTE — Telephone Encounter (Signed)
Pt is scheduled for 12/14 and 12/16 to pick up diabetic shoes in [place] office. I left message for pt to call to confirm which appt he wants to keep.

## 2021-10-26 ENCOUNTER — Other Ambulatory Visit: Payer: Medicare Other

## 2021-10-28 ENCOUNTER — Ambulatory Visit: Payer: Medicare Other

## 2021-10-28 ENCOUNTER — Other Ambulatory Visit: Payer: Self-pay

## 2021-10-28 ENCOUNTER — Other Ambulatory Visit: Payer: Self-pay | Admitting: Internal Medicine

## 2021-10-28 DIAGNOSIS — L0882 Omphalitis not of newborn: Secondary | ICD-10-CM

## 2021-10-28 DIAGNOSIS — E1142 Type 2 diabetes mellitus with diabetic polyneuropathy: Secondary | ICD-10-CM | POA: Diagnosis not present

## 2021-10-28 DIAGNOSIS — I10 Essential (primary) hypertension: Secondary | ICD-10-CM

## 2021-10-28 DIAGNOSIS — M2042 Other hammer toe(s) (acquired), left foot: Secondary | ICD-10-CM | POA: Diagnosis not present

## 2021-10-28 DIAGNOSIS — M2041 Other hammer toe(s) (acquired), right foot: Secondary | ICD-10-CM | POA: Diagnosis not present

## 2021-10-28 DIAGNOSIS — G4700 Insomnia, unspecified: Secondary | ICD-10-CM

## 2021-10-28 DIAGNOSIS — E1140 Type 2 diabetes mellitus with diabetic neuropathy, unspecified: Secondary | ICD-10-CM

## 2021-10-28 NOTE — Progress Notes (Signed)
SITUATION Reason for Visit: Fitting of Diabetic Shoes & Insoles Patient / Caregiver Report:  Patient reports comfort  OBJECTIVE DATA: Patient History / Diagnosis:     ICD-10-CM   1. Diabetic polyneuropathy associated with type 2 diabetes mellitus (HCC)  E11.42       Change in Status:   None  ACTIONS PERFORMED: In-Person Delivery, patient was fit with: - 1x pair A5500 PDAC approved prefabricated Diabetic Shoes: Highline Orthofeet boots size 13 - 3x pair [id number] PDAC approved CAM milled custom diabetic insoles  Shoes and insoles were verified for structural integrity and safety. Patient wore shoes and insoles in office. Skin was inspected and free of areas of concern after wearing shoes and inserts. Shoes and inserts fit properly. Patient / Caregiver provided with ferbal instruction and demonstration regarding donning, doffing, wear, care, proper fit, function, purpose, cleaning, and use of shoes and insoles ' and in all related precautions and risks and benefits regarding shoes and insoles. Patient / Caregiver was instructed to wear properly fitting socks with shoes at all times. Patient was also provided with verbal instruction regarding how to report any failures or malfunctions of shoes or inserts, and necessary follow up care. Patient / Caregiver was also instructed to contact physician regarding change in status that may affect function of shoes and inserts.   Patient / Caregiver verbalized undersatnding of instruction provided. Patient / Caregiver demonstrated independence with proper donning and doffing of shoes and inserts.  PLAN Patient to follow up as needed. Plan of care was discussed with and agreed upon by patient and/or caregiver. All questions were answered and concerns addressed.

## 2021-10-31 ENCOUNTER — Other Ambulatory Visit: Payer: Self-pay

## 2021-10-31 ENCOUNTER — Encounter: Payer: Self-pay | Admitting: Internal Medicine

## 2021-10-31 ENCOUNTER — Ambulatory Visit (INDEPENDENT_AMBULATORY_CARE_PROVIDER_SITE_OTHER): Payer: Medicare Other | Admitting: Internal Medicine

## 2021-10-31 VITALS — BP 128/86 | HR 77 | Resp 18 | Ht 74.0 in | Wt 323.0 lb

## 2021-10-31 DIAGNOSIS — N522 Drug-induced erectile dysfunction: Secondary | ICD-10-CM | POA: Diagnosis not present

## 2021-10-31 DIAGNOSIS — I10 Essential (primary) hypertension: Secondary | ICD-10-CM | POA: Diagnosis not present

## 2021-10-31 DIAGNOSIS — E1140 Type 2 diabetes mellitus with diabetic neuropathy, unspecified: Secondary | ICD-10-CM | POA: Diagnosis not present

## 2021-10-31 DIAGNOSIS — L0882 Omphalitis not of newborn: Secondary | ICD-10-CM

## 2021-10-31 DIAGNOSIS — N1831 Chronic kidney disease, stage 3a: Secondary | ICD-10-CM

## 2021-10-31 DIAGNOSIS — Z1211 Encounter for screening for malignant neoplasm of colon: Secondary | ICD-10-CM

## 2021-10-31 DIAGNOSIS — H538 Other visual disturbances: Secondary | ICD-10-CM

## 2021-10-31 DIAGNOSIS — K5903 Drug induced constipation: Secondary | ICD-10-CM

## 2021-10-31 NOTE — Patient Instructions (Addendum)
Please continue taking medications as prescribed.  Please continue to follow low carb diet and ambulate as tolerated.  Please continue to take at least 64 ounces of fluid in a day.  Please get fasting blood tests done before the next visit.

## 2021-11-03 ENCOUNTER — Ambulatory Visit: Payer: Medicare Other | Admitting: Internal Medicine

## 2021-11-03 LAB — HM DIABETES EYE EXAM

## 2021-11-04 MED ORDER — CLOTRIMAZOLE-BETAMETHASONE 1-0.05 % EX CREA: 1.0000 "application " | TOPICAL_CREAM | Freq: Every day | CUTANEOUS | 0 refills | Status: DC

## 2021-11-04 NOTE — Assessment & Plan Note (Signed)
BP Readings from Last 1 Encounters:  10/31/21 128/86   Well controlled with losartan 50 mg QD Counseled for compliance with the medications Advised DASH diet and moderate exercise/walking, at least 150 mins/week

## 2021-11-04 NOTE — Assessment & Plan Note (Signed)
Was thought to be due to Elavil, was discontinued in the last visit MiraLAX as needed Maintain adequate hydration Prune juice as needed

## 2021-11-04 NOTE — Assessment & Plan Note (Signed)
Lab Results  Component Value Date   HGBA1C 6.9 08/04/2021   On Jardiance 10 mg QD Advised to follow diabetic diet On statin and ARB F/u CMP and lipid panel Diabetic eye exam: Advised to follow up with Ophthalmology for diabetic eye exam

## 2021-11-04 NOTE — Progress Notes (Signed)
Established Patient Office Visit  Subjective:  Patient ID: Francisco Huffman, male    DOB: [birth year]  Age: 50 y.o. MRN: [id number]  CC:  Chief Complaint  Patient presents with   Follow-up    3 month follow up pt states that meds if he lays around make him loopy and also he has been having constipation     HPI Francisco Huffman is a 50 y.o. male with past medical history of  DM with neuropathy, HLD, CKD stage 3a, PTSD, insomnia and morbid obesity who presents for f/u of his chronic medical conditions.  HTN: BP is well-controlled. Takes medications regularly. Patient denies headache, dizziness, chest pain, dyspnea or palpitations.  Type II DM: He is Hgb A1c has improved to 6.9.  He has been taking Jardiance. He has been following low-carb diet.  He is seeing nephrology for CKD stage IIIa.  He denies any polyuria or polyphagia.  He complains of mild blurred vision, which is chronic.  Denies any eye pain or discharge currently.  He still complains of discharge and itching around the umbilicus.  Denies any bleeding from the area now.  He still complains of constipation despite discontinuing Elavil in the last visit.  He has not tried MiraLAX yet.  Denies any melena or hematochezia currently.   Past Medical History:  Diagnosis Date   CKD (chronic kidney disease) stage 3, GFR 30-59 ml/min (HCC)    Diabetes mellitus without complication (HCC)    Diabetic neuropathy ([place])    Hyperlipidemia     History reviewed. No pertinent surgical history.  History reviewed. No pertinent family history.  Social History   Socioeconomic History   Marital status: Divorced    Spouse name: Not on file   Number of children: Not on file   Years of education: Not on file   Highest education level: Not on file  Occupational History   Not on file  Tobacco Use   Smoking status: Never   Smokeless tobacco: Never  Substance and Sexual Activity   Alcohol use: Never   Drug use: Yes    Types: Marijuana     Comment: uses at night   Sexual activity: Not on file  Other Topics Concern   Not on file  Social History Narrative   Not on file   Social Determinants of Health   Financial Resource Strain: Low Risk    Difficulty of Paying Living Expenses: Not very hard  Food Insecurity: No Food Insecurity   Worried About Running Out of Food in the Last Year: Never true   [place] in the Last Year: Never true  Transportation Needs: No Transportation Needs   Lack of Transportation (Medical): No   Lack of Transportation (Non-Medical): No  Physical Activity: Inactive   Days of Exercise per Week: 0 days   Minutes of Exercise per Session: 0 min  Stress: Stress Concern Present   Feeling of Stress : To some extent  Social Connections: Socially Isolated   Frequency of Communication with Friends and Family: More than three times a week   Frequency of Social Gatherings with Friends and Family: More than three times a week   Attends Religious Services: Never   Marine scientist or Organizations: No   Attends Archivist Meetings: Never   Marital Status: Divorced  Human resources officer Violence: Not At Risk   Fear of Current or Ex-Partner: No   Emotionally Abused: No   Physically Abused: No   Sexually  Abused: No    Outpatient Medications Prior to Visit  Medication Sig Dispense Refill   Cholecalciferol 125 MCG (5000 UT) capsule Take by mouth.     gabapentin (NEURONTIN) 300 MG capsule Take 1 capsule (300 mg total) by mouth 3 (three) times daily. 90 capsule 3   JARDIANCE 10 MG TABS tablet TAKE 1 TABLET BY MOUTH DAILY BEFORE BREAKFAST. 30 tablet 2   losartan (COZAAR) 50 MG tablet TAKE 1 TABLET BY MOUTH EVERY DAY 90 tablet 0   meloxicam (MOBIC) 15 MG tablet TAKE 1 TABLET (15 MG TOTAL) BY MOUTH DAILY. 30 tablet 3   polyethylene glycol powder (GLYCOLAX/MIRALAX) 17 GM/SCOOP powder Take 17 g by mouth 2 (two) times daily as needed for moderate constipation. 3350 g 1   rosuvastatin (CRESTOR) 20  MG tablet TAKE 1 TABLET BY MOUTH EVERY DAY 90 tablet 5   sildenafil (VIAGRA) 25 MG tablet Take 1 tablet (25 mg total) by mouth daily as needed for erectile dysfunction. 30 tablet 2   traZODone (DESYREL) 150 MG tablet TAKE 1 TABLET (150 MG TOTAL) BY MOUTH AT BEDTIME AS NEEDED. FOR SLEEP 90 tablet 1   augmented betamethasone dipropionate (DIPROLENE-AF) 0.05 % cream Apply topically daily. 50 g 0   No facility-administered medications prior to visit.    No Known Allergies  ROS Review of Systems  Constitutional:  Negative for chills and fever.  HENT:  Negative for congestion and sore throat.   Eyes:  Negative for pain and discharge.  Respiratory:  Negative for cough and shortness of breath.   Cardiovascular:  Negative for chest pain and palpitations.  Gastrointestinal:  Negative for diarrhea, nausea and vomiting.  Endocrine: Negative for polydipsia and polyuria.  Genitourinary:  Negative for dysuria and hematuria.  Musculoskeletal:  Negative for neck pain and neck stiffness.       B/l heel pain  Skin:  Negative for rash.  Neurological:  Negative for dizziness, weakness, numbness and headaches.  Psychiatric/Behavioral:  Positive for agitation and sleep disturbance. Negative for behavioral problems and dysphoric mood. The patient is nervous/anxious.      Objective:    Physical Exam Vitals reviewed.  Constitutional:      General: He is not in acute distress.    Appearance: He is obese. He is not diaphoretic.  HENT:     Head: Normocephalic and atraumatic.     Nose: Nose normal.     Mouth/Throat:     Mouth: Mucous membranes are moist.  Eyes:     General: No scleral icterus.    Extraocular Movements: Extraocular movements intact.  Cardiovascular:     Rate and Rhythm: Normal rate and regular rhythm.     Pulses: Normal pulses.     Heart sounds: Normal heart sounds. No murmur heard. Pulmonary:     Breath sounds: Normal breath sounds. No wheezing or rales.  Abdominal:      Palpations: Abdomen is soft.     Tenderness: There is no abdominal tenderness.     Comments: Scant serous discharge from umbilicus  Musculoskeletal:     Cervical back: Neck supple. No tenderness.     Right lower leg: No edema.     Left lower leg: No edema.  Skin:    General: Skin is warm.     Findings: No rash.  Neurological:     General: No focal deficit present.     Mental Status: He is alert and oriented to person, place, and time.  Psychiatric:  Mood and Affect: Mood normal.        Behavior: Behavior normal.    BP 128/86 (BP Location: Left Arm, Patient Position: Sitting, Cuff Size: Normal)    Pulse 77    Resp 18    Ht 6' 2"  (1.88 m)    Wt (!) 323 lb (146.5 kg)    SpO2 96%    BMI 41.47 kg/m  Wt Readings from Last 3 Encounters:  10/31/21 (!) 323 lb (146.5 kg)  08/04/21 (!) 322 lb 1.3 oz (146.1 kg)  06/28/21 (!) 327 lb 0.6 oz (148.3 kg)    Lab Results  Component Value Date   TSH 0.919 04/26/2021   Lab Results  Component Value Date   WBC 9.1 10/19/2021   HGB 17.2 (H) 10/19/2021   HCT 50.0 10/19/2021   MCV 83.1 10/19/2021   PLT 278 10/19/2021   Lab Results  Component Value Date   NA 137 10/19/2021   K 4.1 10/19/2021   CO2 29 10/19/2021   GLUCOSE 250 (H) 10/19/2021   BUN 26 (H) 10/19/2021   CREATININE 1.72 (H) 10/19/2021   BILITOT 0.4 04/26/2021   ALKPHOS 77 04/26/2021   AST 17 04/26/2021   ALT 27 04/26/2021   PROT 7.7 04/26/2021   ALBUMIN 4.4 10/19/2021   CALCIUM 9.5 10/19/2021   ANIONGAP 10 10/19/2021   EGFR 53 (L) 06/28/2021   Lab Results  Component Value Date   CHOL 282 (H) 04/26/2021   Lab Results  Component Value Date   HDL 32 (L) 04/26/2021   Lab Results  Component Value Date   LDLCALC 151 (H) 04/26/2021   Lab Results  Component Value Date   TRIG 514 (H) 04/26/2021   Lab Results  Component Value Date   CHOLHDL 8.8 (H) 04/26/2021   Lab Results  Component Value Date   HGBA1C 6.9 08/04/2021      Assessment & Plan:   Problem  List Items Addressed This Visit       Cardiovascular and Mediastinum   Essential hypertension    BP Readings from Last 1 Encounters:  10/31/21 128/86  Well controlled with losartan 50 mg QD Counseled for compliance with the medications Advised DASH diet and moderate exercise/walking, at least 150 mins/week         Digestive   Drug-induced constipation    Was thought to be due to Elavil, was discontinued in the last visit MiraLAX as needed Maintain adequate hydration Prune juice as needed        Endocrine   Type 2 diabetes mellitus with diabetic neuropathy, without long-term current use of insulin ([place]) - Primary    Lab Results  Component Value Date   HGBA1C 6.9 08/04/2021  On Jardiance 10 mg QD Advised to follow diabetic diet On statin and ARB F/u CMP and lipid panel Diabetic eye exam: Advised to follow up with Ophthalmology for diabetic eye exam       Relevant Orders   Basic Metabolic Panel (BMET)   Hemoglobin A1c     Genitourinary   Chronic kidney disease, stage 3a ([place])    Likely due to underlying DM and HTN Followed by Nephrology Avoid nephrotoxic agents On Jardiance        Other   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    Serous/mucoid with intermittent blood tinged discharge from umbilical area Lotrisone cream prescribed      Relevant Medications   clotrimazole-betamethasone (LOTRISONE) cream   Drug-induced erectile dysfunction    Due to multiple medications started recently Sildenafil  PRN      Other Visit Diagnoses     Blurred vision       Relevant Orders   Ambulatory referral to Ophthalmology   Screen for colon cancer       Relevant Orders   Cologuard       Meds ordered this encounter  Medications   clotrimazole-betamethasone (LOTRISONE) cream    Sig: Apply 1 application topically daily.    Dispense:  30 g    Refill:  0    Follow-up: Return in about 4 months (around 03/01/2022) for HTN, DM and CKD.    Lindell Spar, MD

## 2021-11-04 NOTE — Assessment & Plan Note (Signed)
Due to multiple medications started recently Sildenafil PRN

## 2021-11-04 NOTE — Assessment & Plan Note (Signed)
Likely due to underlying DM and HTN Followed by Nephrology Avoid nephrotoxic agents On Jardiance

## 2021-11-04 NOTE — Assessment & Plan Note (Signed)
Serous/mucoid with intermittent blood tinged discharge from umbilical area Lotrisone cream prescribed

## 2021-11-18 LAB — COLOGUARD: COLOGUARD: NEGATIVE

## 2021-11-22 ENCOUNTER — Telehealth: Payer: Self-pay | Admitting: Internal Medicine

## 2021-11-22 NOTE — Telephone Encounter (Signed)
Pt advised with verbal understanding  °

## 2021-11-22 NOTE — Telephone Encounter (Signed)
Pt is calling back regarding labs °

## 2021-12-07 ENCOUNTER — Encounter: Payer: Self-pay | Admitting: *Deleted

## 2021-12-12 ENCOUNTER — Other Ambulatory Visit: Payer: Self-pay | Admitting: Podiatry

## 2022-01-05 DIAGNOSIS — R82992 Hyperoxaluria: Secondary | ICD-10-CM | POA: Diagnosis not present

## 2022-01-05 DIAGNOSIS — I129 Hypertensive chronic kidney disease with stage 1 through stage 4 chronic kidney disease, or unspecified chronic kidney disease: Secondary | ICD-10-CM | POA: Diagnosis not present

## 2022-01-05 DIAGNOSIS — N200 Calculus of kidney: Secondary | ICD-10-CM | POA: Diagnosis not present

## 2022-01-05 DIAGNOSIS — N189 Chronic kidney disease, unspecified: Secondary | ICD-10-CM | POA: Diagnosis not present

## 2022-01-05 DIAGNOSIS — R82991 Hypocitraturia: Secondary | ICD-10-CM | POA: Diagnosis not present

## 2022-01-05 DIAGNOSIS — E1129 Type 2 diabetes mellitus with other diabetic kidney complication: Secondary | ICD-10-CM | POA: Diagnosis not present

## 2022-01-05 DIAGNOSIS — E1122 Type 2 diabetes mellitus with diabetic chronic kidney disease: Secondary | ICD-10-CM | POA: Diagnosis not present

## 2022-01-05 DIAGNOSIS — R82994 Hypercalciuria: Secondary | ICD-10-CM | POA: Diagnosis not present

## 2022-01-05 DIAGNOSIS — R809 Proteinuria, unspecified: Secondary | ICD-10-CM | POA: Diagnosis not present

## 2022-01-13 ENCOUNTER — Other Ambulatory Visit: Payer: Self-pay | Admitting: Internal Medicine

## 2022-01-13 DIAGNOSIS — E1140 Type 2 diabetes mellitus with diabetic neuropathy, unspecified: Secondary | ICD-10-CM

## 2022-02-07 ENCOUNTER — Other Ambulatory Visit: Payer: Self-pay | Admitting: Podiatry

## 2022-02-09 ENCOUNTER — Telehealth: Payer: Self-pay | Admitting: Podiatry

## 2022-02-09 DIAGNOSIS — I129 Hypertensive chronic kidney disease with stage 1 through stage 4 chronic kidney disease, or unspecified chronic kidney disease: Secondary | ICD-10-CM | POA: Diagnosis not present

## 2022-02-09 DIAGNOSIS — E1122 Type 2 diabetes mellitus with diabetic chronic kidney disease: Secondary | ICD-10-CM | POA: Diagnosis not present

## 2022-02-09 DIAGNOSIS — N189 Chronic kidney disease, unspecified: Secondary | ICD-10-CM | POA: Diagnosis not present

## 2022-02-09 DIAGNOSIS — N200 Calculus of kidney: Secondary | ICD-10-CM | POA: Diagnosis not present

## 2022-02-09 DIAGNOSIS — R809 Proteinuria, unspecified: Secondary | ICD-10-CM | POA: Diagnosis not present

## 2022-02-09 DIAGNOSIS — R82992 Hyperoxaluria: Secondary | ICD-10-CM | POA: Diagnosis not present

## 2022-02-09 DIAGNOSIS — R82994 Hypercalciuria: Secondary | ICD-10-CM | POA: Diagnosis not present

## 2022-02-09 DIAGNOSIS — R82991 Hypocitraturia: Secondary | ICD-10-CM | POA: Diagnosis not present

## 2022-02-09 NOTE — Telephone Encounter (Signed)
Called pt and he said that the kidney doctor said his side effects sound like they can be from the gabepentin. He is having days of weakness where he can hardly hold his head up, then other days he is good, other days he is good and then all of a sudden he feels drunk , stepping sideways(off balance) and speech is affected. But it is working for the most part for the pain. ?

## 2022-02-09 NOTE — Telephone Encounter (Signed)
Pt called stating the gabapentin you have him on works for the pain but is having a lot of other side effects. Please call pt to discuss. ?

## 2022-02-11 ENCOUNTER — Other Ambulatory Visit: Payer: Self-pay | Admitting: Internal Medicine

## 2022-02-11 DIAGNOSIS — N522 Drug-induced erectile dysfunction: Secondary | ICD-10-CM

## 2022-02-14 ENCOUNTER — Ambulatory Visit (INDEPENDENT_AMBULATORY_CARE_PROVIDER_SITE_OTHER): Payer: Medicare HMO | Admitting: Internal Medicine

## 2022-02-14 ENCOUNTER — Encounter: Payer: Self-pay | Admitting: Internal Medicine

## 2022-02-14 ENCOUNTER — Telehealth: Payer: Self-pay | Admitting: *Deleted

## 2022-02-14 DIAGNOSIS — K5903 Drug induced constipation: Secondary | ICD-10-CM | POA: Diagnosis not present

## 2022-02-14 DIAGNOSIS — K649 Unspecified hemorrhoids: Secondary | ICD-10-CM | POA: Diagnosis not present

## 2022-02-14 DIAGNOSIS — E1140 Type 2 diabetes mellitus with diabetic neuropathy, unspecified: Secondary | ICD-10-CM

## 2022-02-14 MED ORDER — HYDROCORTISONE ACETATE 25 MG RE SUPP: 25.0000 mg | Freq: Two times a day (BID) | RECTAL | 0 refills | Status: DC

## 2022-02-14 MED ORDER — PREGABALIN 50 MG PO CAPS: 50.0000 mg | ORAL_CAPSULE | Freq: Two times a day (BID) | ORAL | 2 refills | Status: DC

## 2022-02-14 NOTE — Telephone Encounter (Signed)
Per Dr. Al Corpus -  ? ?Cut the dose of gabapentin down by one pill every week until stopped completely and see how he feels ?

## 2022-02-14 NOTE — Telephone Encounter (Signed)
"  I am calling Francisco Huffman back.  I talked to Dr. Posey Pronto, my primary care doctor.  He prescribed me a different prescription, I think it starts with an H.  I'm going to pick it up now.  I explained to him about having constipation and how it's having me have pain down there.  He's sending me to another doctor about my stomach."  I will send this information to Dr. Milinda Pointer and his assistant.  If they need to ask any further questions or recommend anything, they'll give you a call. ?

## 2022-02-14 NOTE — Assessment & Plan Note (Signed)
Was thought to be due to Elavil, was discontinued ?DC gabapentin for now ?MiraLAX as needed ?Maintain adequate hydration ?Prune juice as needed ?Referred to GI ?

## 2022-02-14 NOTE — Telephone Encounter (Signed)
Called patient to inform him of instructions regarding gabapentin, but no answer. Left message for a call back.  ?

## 2022-02-14 NOTE — Assessment & Plan Note (Signed)
Rectal soreness, worse with chronic constipation ?Could be due to internal hemorrhoids ?Anusol suppository as needed ?Referred to GI ?

## 2022-02-14 NOTE — Assessment & Plan Note (Signed)
Lab Results  ?Component Value Date  ? HGBA1C 6.9 08/04/2021  ? ?On Jardiance 10 mg QD ?Advised to follow diabetic diet ?On statin and ARB ?F/u CMP and lipid panel ?Diabetic eye exam: Advised to follow up with Ophthalmology for diabetic eye exam ? ?Switched from gabapentin to Lyrica for DM neuropathy as he has dizziness and constipation with gabapentin. ?

## 2022-02-14 NOTE — Patient Instructions (Signed)
Please use Anusol suppository for rectal soreness. ? ?Please continue to take MiraLAX as needed for constipation. ? ?Please stop taking gabapentin and start taking Lyrica instead. ? ?You are being referred to GI. ?

## 2022-02-14 NOTE — Progress Notes (Signed)
?  ? ?Virtual Visit via Telephone Note  ? ?This visit type was conducted due to national recommendations for restrictions regarding the COVID-19 Pandemic (e.g. social distancing) in an effort to limit this patient's exposure and mitigate transmission in our community.  Due to his co-morbid illnesses, this patient is at least at moderate risk for complications without adequate follow up.  This format is felt to be most appropriate for this patient at this time.  The patient did not have access to video technology/had technical difficulties with video requiring transitioning to audio format only (telephone).  All issues noted in this document were discussed and addressed.  No physical exam could be performed with this format. ? ?Evaluation Performed:  Follow-up visit ? ?Date:  02/14/2022  ? ?ID:  Francisco Huffman, DOB 07-02-1971, MRN [id number] ? ?Patient Location: Home ?Provider Location: Office/Clinic ? ?Participants: Patient ?Location of Patient: Home ?Location of Provider: Telehealth ?Consent was obtain for visit to be over via telehealth. ?I verified that I am speaking with the correct person using two identifiers. ? ?PCP:  Lindell Spar, MD  ? ?Chief Complaint: Chronic constipation, rectal soreness and dizziness ? ?History of Present Illness:   ? ?Francisco Huffman is a 51 y.o. male who has a televisit for complaint of chronic constipation, which has been causing rectal soreness as well.  He states that he used to strain hard for passing stool in the past before starting to use MiraLAX and Metamucil.  He currently denies any melena or hematochezia.  He states that he has not felt any bulging around the rectal area. ? ?He has been taking gabapentin for neuropathy, and attributes his dizziness and constipation to it.  He asks if he can take for DM neuropathy. ? ?The patient does not have symptoms concerning for COVID-19 infection (fever, chills, cough, or new shortness of breath).  ? ?Past Medical, Surgical, Social  History, Allergies, and Medications have been Reviewed. ? ?Past Medical History:  ?Diagnosis Date  ? CKD (chronic kidney disease) stage 3, GFR 30-59 ml/min (HCC)   ? Diabetes mellitus without complication (Francisco Huffman)   ? Diabetic neuropathy (Francisco Huffman)   ? Hyperlipidemia   ? ?History reviewed. No pertinent surgical history.  ? ?Current Meds  ?Medication Sig  ? Cholecalciferol 125 MCG (5000 UT) capsule Take by mouth.  ? clotrimazole-betamethasone (LOTRISONE) cream Apply 1 application topically daily.  ? gabapentin (NEURONTIN) 300 MG capsule TAKE 1 CAPSULE BY MOUTH THREE TIMES A DAY  ? JARDIANCE 10 MG TABS tablet TAKE 1 TABLET BY MOUTH EVERY DAY BEFORE BREAKFAST  ? losartan (COZAAR) 50 MG tablet TAKE 1 TABLET BY MOUTH EVERY DAY  ? meloxicam (MOBIC) 15 MG tablet TAKE 1 TABLET (15 MG TOTAL) BY MOUTH DAILY.  ? polyethylene glycol powder (GLYCOLAX/MIRALAX) 17 GM/SCOOP powder Take 17 g by mouth 2 (two) times daily as needed for moderate constipation.  ? rosuvastatin (CRESTOR) 20 MG tablet TAKE 1 TABLET BY MOUTH EVERY DAY  ? sildenafil (VIAGRA) 25 MG tablet TAKE 1 TABLET BY MOUTH ONCE DAILY AS NEEDED FOR ERECTILE DYSFUNCTION  ? traZODone (DESYREL) 150 MG tablet TAKE 1 TABLET (150 MG TOTAL) BY MOUTH AT BEDTIME AS NEEDED. FOR SLEEP  ?  ? ?Allergies:   Patient has no known allergies.  ? ?ROS:   ?Please see the history of present illness.    ? ?All other systems reviewed and are negative. ? ? ?Labs/Other Tests and Data Reviewed:   ? ?Recent Labs: ?04/26/2021: ALT 27; TSH 0.919 ?10/19/2021:  BUN 26; Creatinine, Ser 1.72; Hemoglobin 17.2; Platelets 278; Potassium 4.1; Sodium 137  ? ?Recent Lipid Panel ?Lab Results  ?Component Value Date/Time  ? CHOL 282 (H) 04/26/2021 03:32 PM  ? TRIG 514 (H) 04/26/2021 03:32 PM  ? HDL 32 (L) 04/26/2021 03:32 PM  ? CHOLHDL 8.8 (H) 04/26/2021 03:32 PM  ? LDLCALC 151 (H) 04/26/2021 03:32 PM  ? ? ?Wt Readings from Last 3 Encounters:  ?10/31/21 (!) 323 lb (146.5 kg)  ?08/04/21 (!) 322 lb 1.3 oz (146.1 kg)   ?06/28/21 (!) 327 lb 0.6 oz (148.3 kg)  ?  ? ?ASSESSMENT & PLAN:   ? ?Drug-induced constipation ?Was thought to be due to Elavil, was discontinued ?DC gabapentin for now ?MiraLAX as needed ?Maintain adequate hydration ?Prune juice as needed ?Referred to GI ? ?Hemorrhoids ?Rectal soreness, worse with chronic constipation ?Could be due to internal hemorrhoids ?Anusol suppository as needed ?Referred to GI ? ?Type 2 diabetes mellitus with diabetic neuropathy, without long-term current use of insulin (Francisco Huffman) ?Lab Results  ?Component Value Date  ? HGBA1C 6.9 08/04/2021  ? ?On Jardiance 10 mg QD ?Advised to follow diabetic diet ?On statin and ARB ?F/u CMP and lipid panel ?Diabetic eye exam: Advised to follow up with Ophthalmology for diabetic eye exam ? ?Switched from gabapentin to Lyrica for DM neuropathy as he has dizziness and constipation with gabapentin. ? ? ? ?Time:   ?Today, I have spent 19 minutes reviewing the chart, including problem list, medications, and with the patient with telehealth technology discussing the above problems. ? ? ?Medication Adjustments/Labs and Tests Ordered: ?Current medicines are reviewed at length with the patient today.  Concerns regarding medicines are outlined above.  ? ?Tests Ordered: ?No orders of the defined types were placed in this encounter. ? ? ?Medication Changes: ?No orders of the defined types were placed in this encounter. ? ? ? ?Note: This dictation was prepared with Dragon dictation along with smaller phrase technology. Similar sounding words can be transcribed inadequately or may not be corrected upon review. Any transcriptional errors that result from this process are unintentional.  ?  ? ? ?Disposition:  Follow up  ?Signed, ?Lindell Spar, MD  ?02/14/2022 12:00 PM    ? ?[facility] ?[facility] ?

## 2022-02-15 ENCOUNTER — Encounter: Payer: Self-pay | Admitting: Internal Medicine

## 2022-02-15 NOTE — Telephone Encounter (Signed)
Called patient; left message to call back.

## 2022-02-15 NOTE — Telephone Encounter (Signed)
Patient called back -  ? ?He states that is PCP Rx'd pregablin 50mg  BID. He only started it a few days ago. I advised him to monitor its pain control and other symptoms seeing this is similar to the gabapentin. Advised to keep his doctor informed in case the dosage needed to be changed. Patient understood and agreed. ?

## 2022-02-28 ENCOUNTER — Other Ambulatory Visit: Payer: Self-pay | Admitting: Internal Medicine

## 2022-02-28 DIAGNOSIS — L0882 Omphalitis not of newborn: Secondary | ICD-10-CM

## 2022-03-01 ENCOUNTER — Ambulatory Visit (INDEPENDENT_AMBULATORY_CARE_PROVIDER_SITE_OTHER): Payer: Medicare HMO | Admitting: Internal Medicine

## 2022-03-01 ENCOUNTER — Encounter: Payer: Self-pay | Admitting: Internal Medicine

## 2022-03-01 VITALS — BP 140/88 | HR 77 | Resp 18 | Ht 74.0 in | Wt 321.8 lb

## 2022-03-01 DIAGNOSIS — M19072 Primary osteoarthritis, left ankle and foot: Secondary | ICD-10-CM

## 2022-03-01 DIAGNOSIS — E559 Vitamin D deficiency, unspecified: Secondary | ICD-10-CM

## 2022-03-01 DIAGNOSIS — E782 Mixed hyperlipidemia: Secondary | ICD-10-CM

## 2022-03-01 DIAGNOSIS — N1831 Chronic kidney disease, stage 3a: Secondary | ICD-10-CM

## 2022-03-01 DIAGNOSIS — M19071 Primary osteoarthritis, right ankle and foot: Secondary | ICD-10-CM

## 2022-03-01 DIAGNOSIS — E1140 Type 2 diabetes mellitus with diabetic neuropathy, unspecified: Secondary | ICD-10-CM

## 2022-03-01 DIAGNOSIS — N522 Drug-induced erectile dysfunction: Secondary | ICD-10-CM

## 2022-03-01 DIAGNOSIS — I10 Essential (primary) hypertension: Secondary | ICD-10-CM | POA: Diagnosis not present

## 2022-03-01 DIAGNOSIS — S99921A Unspecified injury of right foot, initial encounter: Secondary | ICD-10-CM | POA: Diagnosis not present

## 2022-03-01 MED ORDER — TRAMADOL HCL 50 MG PO TABS: 50.0000 mg | ORAL_TABLET | Freq: Two times a day (BID) | ORAL | 0 refills | Status: DC | PRN

## 2022-03-01 MED ORDER — MUPIROCIN 2 % EX OINT: 1.0000 "application " | TOPICAL_OINTMENT | Freq: Two times a day (BID) | CUTANEOUS | 0 refills | Status: DC

## 2022-03-01 MED ORDER — SILDENAFIL CITRATE 50 MG PO TABS: 50.0000 mg | ORAL_TABLET | Freq: Every day | ORAL | 1 refills | Status: DC | PRN

## 2022-03-01 NOTE — Assessment & Plan Note (Signed)
Chronic foot pain ?Takes Mobic currently, discontinued today due to CKD ?Tylenol arthritis as needed ?Tramadol as needed for severe pain ?

## 2022-03-01 NOTE — Assessment & Plan Note (Signed)
Diet modification and moderate exercise/walking advised 

## 2022-03-01 NOTE — Assessment & Plan Note (Signed)
Lab Results  ?Component Value Date  ? HGBA1C 6.9 08/04/2021  ? ?On Jardiance 10 mg QD ?Advised to follow diabetic diet ?On statin and ARB ?F/u CMP and lipid panel ?Diabetic eye exam: Advised to follow up with Ophthalmology for diabetic eye exam ? ?Switched from gabapentin to Lyrica for DM neuropathy ?

## 2022-03-01 NOTE — Assessment & Plan Note (Signed)
Due to multiple medications started recently ?Sildenafil - increased dose to 50 mg PRN ?

## 2022-03-01 NOTE — Progress Notes (Signed)
? ?Established Patient Office Visit ? ?Subjective:  ?Patient ID: Francisco Huffman, male    DOB: November 05, 1971  Age: 51 y.o. MRN: [id number] ? ?CC:  ?Chief Complaint  ?Patient presents with  ? Follow-up  ?  4 month follow up HTN DM and CKD pt still feeling fatigued   ? ? ?HPI ?Francisco Huffman is a 51 y.o. male with past medical history of DM with neuropathy, HLD, CKD stage 3a, PTSD, insomnia and morbid obesity who presents for f/u of his chronic medical conditions. ? ?He complains of erectile dysfunction despite taking sildenafil 25 mg, he has tried taking 2 tablets with better response.  He currently denies any dysuria or hematuria. ? ?HTN: BP is well-controlled. Takes medications regularly. Patient denies headache, dizziness, chest pain, dyspnea or palpitations. ?  ?Type II DM: He is Hgb A1c has improved to 6.9.  He has been taking Jardiance. He has been following low-carb diet.  He is seeing nephrology for CKD stage IIIa.  He denies any polyuria or polyphagia.  He continues to c/o fatigue, but denies any weight loss, night sweats, chronic cough or hemoptysis currently. ? ?He complains of right foot injury from an aluminium plate about a week, and has redness over right foot area near big toe.  No active bleeding is noted currently.  He has not had Tdap vaccine yet. ? ?Past Medical History:  ?Diagnosis Date  ? CKD (chronic kidney disease) stage 3, GFR 30-59 ml/min (HCC)   ? Diabetes mellitus without complication ([place])   ? Diabetic neuropathy ([place])   ? Hyperlipidemia   ? ? ?History reviewed. No pertinent surgical history. ? ?History reviewed. No pertinent family history. ? ?Social History  ? ?Socioeconomic History  ? Marital status: Divorced  ?  Spouse name: Not on file  ? Number of children: Not on file  ? Years of education: Not on file  ? Highest education level: Not on file  ?Occupational History  ? Not on file  ?Tobacco Use  ? Smoking status: Never  ? Smokeless tobacco: Never  ?Substance and Sexual Activity  ? Alcohol  use: Never  ? Drug use: Yes  ?  Types: Marijuana  ?  Comment: uses at night  ? Sexual activity: Not on file  ?Other Topics Concern  ? Not on file  ?Social History Narrative  ? Not on file  ? ?Social Determinants of Health  ? ?Financial Resource Strain: Low Risk   ? Difficulty of Paying Living Expenses: Not very hard  ?Food Insecurity: No Food Insecurity  ? Worried About Charity fundraiser in the Last Year: Never true  ? Ran Out of Food in the Last Year: Never true  ?Transportation Needs: No Transportation Needs  ? Lack of Transportation (Medical): No  ? Lack of Transportation (Non-Medical): No  ?Physical Activity: Inactive  ? Days of Exercise per Week: 0 days  ? Minutes of Exercise per Session: 0 min  ?Stress: Stress Concern Present  ? Feeling of Stress : To some extent  ?Social Connections: Socially Isolated  ? Frequency of Communication with Friends and Family: More than three times a week  ? Frequency of Social Gatherings with Friends and Family: More than three times a week  ? Attends Religious Services: Never  ? Active Member of Clubs or Organizations: No  ? Attends Archivist Meetings: Never  ? Marital Status: Divorced  ?Intimate Partner Violence: Not At Risk  ? Fear of Current or Ex-Partner: No  ? Emotionally  Abused: No  ? Physically Abused: No  ? Sexually Abused: No  ? ? ?Outpatient Medications Prior to Visit  ?Medication Sig Dispense Refill  ? Cholecalciferol 125 MCG (5000 UT) capsule Take by mouth.    ? clotrimazole-betamethasone (LOTRISONE) cream Apply 1 application topically daily. 30 g 0  ? hydrocortisone (ANUSOL-HC) 25 MG suppository Place 1 suppository (25 mg total) rectally 2 (two) times daily. 12 suppository 0  ? JARDIANCE 10 MG TABS tablet TAKE 1 TABLET BY MOUTH EVERY DAY BEFORE BREAKFAST 30 tablet 5  ? losartan (COZAAR) 50 MG tablet TAKE 1 TABLET BY MOUTH EVERY DAY 90 tablet 0  ? polyethylene glycol powder (GLYCOLAX/MIRALAX) 17 GM/SCOOP powder Take 17 g by mouth 2 (two) times daily as  needed for moderate constipation. 3350 g 1  ? pregabalin (LYRICA) 50 MG capsule Take 1 capsule (50 mg total) by mouth 2 (two) times daily. 60 capsule 2  ? rosuvastatin (CRESTOR) 20 MG tablet TAKE 1 TABLET BY MOUTH EVERY DAY 90 tablet 5  ? traZODone (DESYREL) 150 MG tablet TAKE 1 TABLET (150 MG TOTAL) BY MOUTH AT BEDTIME AS NEEDED. FOR SLEEP 90 tablet 1  ? meloxicam (MOBIC) 15 MG tablet TAKE 1 TABLET (15 MG TOTAL) BY MOUTH DAILY. 30 tablet 3  ? sildenafil (VIAGRA) 25 MG tablet TAKE 1 TABLET BY MOUTH ONCE DAILY AS NEEDED FOR ERECTILE DYSFUNCTION 30 tablet 0  ? ?No facility-administered medications prior to visit.  ? ? ?No Known Allergies ? ?ROS ?Review of Systems  ?Constitutional:  Positive for fatigue. Negative for chills and fever.  ?HENT:  Negative for congestion and sore throat.   ?Eyes:  Negative for pain and discharge.  ?Respiratory:  Negative for cough and shortness of breath.   ?Cardiovascular:  Negative for chest pain and palpitations.  ?Gastrointestinal:  Negative for diarrhea, nausea and vomiting.  ?Endocrine: Negative for polydipsia and polyuria.  ?Genitourinary:  Negative for dysuria and hematuria.  ?Musculoskeletal:  Negative for neck pain and neck stiffness.  ?     B/l heel pain  ?Skin:  Negative for rash.  ?Neurological:  Negative for dizziness, weakness, numbness and headaches.  ?Psychiatric/Behavioral:  Positive for agitation and sleep disturbance. Negative for behavioral problems and dysphoric mood. The patient is nervous/anxious.   ? ?  ?Objective:  ?  ?Physical Exam ?Vitals reviewed.  ?Constitutional:   ?   General: He is not in acute distress. ?   Appearance: He is obese. He is not diaphoretic.  ?HENT:  ?   Head: Normocephalic and atraumatic.  ?   Nose: Nose normal.  ?   Mouth/Throat:  ?   Mouth: Mucous membranes are moist.  ?Eyes:  ?   General: No scleral icterus. ?   Extraocular Movements: Extraocular movements intact.  ?Cardiovascular:  ?   Rate and Rhythm: Normal rate and regular rhythm.  ?    Pulses: Normal pulses.  ?   Heart sounds: Normal heart sounds. No murmur heard. ?Pulmonary:  ?   Breath sounds: Normal breath sounds. No wheezing or rales.  ?Abdominal:  ?   Palpations: Abdomen is soft.  ?   Tenderness: There is no abdominal tenderness.  ?Musculoskeletal:     ?   General: Signs of injury (Right foot -dorsal aspect near great toe, redness with central scab) present.  ?   Cervical back: Neck supple. No tenderness.  ?   Right lower leg: No edema.  ?   Left lower leg: No edema.  ?Skin: ?   General:  Skin is warm.  ?   Findings: No rash.  ?Neurological:  ?   General: No focal deficit present.  ?   Mental Status: He is alert and oriented to person, place, and time.  ?Psychiatric:     ?   Mood and Affect: Mood normal.     ?   Behavior: Behavior normal.  ? ? ?BP 140/88 (BP Location: Left Arm, Patient Position: Sitting, Cuff Size: Normal)   Pulse 77   Resp 18   Ht _0  (1.88 m)   Wt (!) 321 lb 12.8 oz (146 kg)   SpO2 95%   BMI 41.32 kg/m?  ?Wt Readings from Last 3 Encounters:  ?03/01/22 (!) 321 lb 12.8 oz (146 kg)  ?10/31/21 (!) 323 lb (146.5 kg)  ?08/04/21 (!) 322 lb 1.3 oz (146.1 kg)  ? ? ?Lab Results  ?Component Value Date  ? TSH 0.919 04/26/2021  ? ?Lab Results  ?Component Value Date  ? WBC 9.1 10/19/2021  ? HGB 17.2 (H) 10/19/2021  ? HCT 50.0 10/19/2021  ? MCV 83.1 10/19/2021  ? PLT 278 10/19/2021  ? ?Lab Results  ?Component Value Date  ? NA 137 10/19/2021  ? K 4.1 10/19/2021  ? CO2 29 10/19/2021  ? GLUCOSE 250 (H) 10/19/2021  ? BUN 26 (H) 10/19/2021  ? CREATININE 1.72 (H) 10/19/2021  ? BILITOT 0.4 04/26/2021  ? ALKPHOS 77 04/26/2021  ? AST 17 04/26/2021  ? ALT 27 04/26/2021  ? PROT 7.7 04/26/2021  ? ALBUMIN 4.4 10/19/2021  ? CALCIUM 9.5 10/19/2021  ? ANIONGAP 10 10/19/2021  ? EGFR 53 (L) 06/28/2021  ? ?Lab Results  ?Component Value Date  ? CHOL 282 (H) 04/26/2021  ? ?Lab Results  ?Component Value Date  ? HDL 32 (L) 04/26/2021  ? ?Lab Results  ?Component Value Date  ? LDLCALC 151 (H) 04/26/2021   ? ?Lab Results  ?Component Value Date  ? TRIG 514 (H) 04/26/2021  ? ?Lab Results  ?Component Value Date  ? CHOLHDL 8.8 (H) 04/26/2021  ? ?Lab Results  ?Component Value Date  ? HGBA1C 6.9 08/04/2021  ? ? ?  ?

## 2022-03-01 NOTE — Assessment & Plan Note (Signed)
On Crestor Check lipid profile 

## 2022-03-01 NOTE — Assessment & Plan Note (Signed)
Likely due to underlying DM and HTN Followed by Nephrology Avoid nephrotoxic agents On Losartan and Jardiance 

## 2022-03-01 NOTE — Assessment & Plan Note (Signed)
BP Readings from Last 1 Encounters:  ?03/01/22 140/88  ? ?Well controlled with losartan 50 mg QD ?Counseled for compliance with the medications ?Advised DASH diet and moderate exercise/walking, at least 150 mins/week ?

## 2022-03-01 NOTE — Patient Instructions (Signed)
Please take medications as prescribed. ? ?Please continue to follow low carb diet and ambulate as tolerated. ? ?Please take Tramadol only for severe pain. Avoid taking it on a daily basis. ?

## 2022-03-01 NOTE — Assessment & Plan Note (Signed)
From aluminium plate, about a week ago, on 04/12 ?Has redness over the foot area ?Advised to get Tdap vaccine at local pharmacy today ?Mupirocin ointment for bacterial ppx ?

## 2022-03-03 LAB — MICROALBUMIN / CREATININE URINE RATIO
Creatinine, Urine: 65.4 mg/dL
Microalb/Creat Ratio: <5 mg/g creat (ref 0–29)
Microalbumin, Urine: <3 ug/mL

## 2022-03-08 ENCOUNTER — Other Ambulatory Visit: Payer: Self-pay | Admitting: Internal Medicine

## 2022-03-08 DIAGNOSIS — G4700 Insomnia, unspecified: Secondary | ICD-10-CM

## 2022-03-08 DIAGNOSIS — L0882 Omphalitis not of newborn: Secondary | ICD-10-CM

## 2022-03-11 ENCOUNTER — Other Ambulatory Visit: Payer: Self-pay | Admitting: Internal Medicine

## 2022-03-11 DIAGNOSIS — G4700 Insomnia, unspecified: Secondary | ICD-10-CM

## 2022-03-16 ENCOUNTER — Other Ambulatory Visit: Payer: Self-pay | Admitting: Internal Medicine

## 2022-03-16 DIAGNOSIS — L0882 Omphalitis not of newborn: Secondary | ICD-10-CM

## 2022-03-17 ENCOUNTER — Ambulatory Visit: Payer: Medicare HMO | Admitting: Gastroenterology

## 2022-04-01 ENCOUNTER — Other Ambulatory Visit: Payer: Self-pay | Admitting: Internal Medicine

## 2022-04-01 DIAGNOSIS — G4700 Insomnia, unspecified: Secondary | ICD-10-CM

## 2022-04-12 DIAGNOSIS — E1129 Type 2 diabetes mellitus with other diabetic kidney complication: Secondary | ICD-10-CM | POA: Diagnosis not present

## 2022-04-12 DIAGNOSIS — R82991 Hypocitraturia: Secondary | ICD-10-CM | POA: Diagnosis not present

## 2022-04-12 DIAGNOSIS — R809 Proteinuria, unspecified: Secondary | ICD-10-CM | POA: Diagnosis not present

## 2022-04-12 DIAGNOSIS — E1122 Type 2 diabetes mellitus with diabetic chronic kidney disease: Secondary | ICD-10-CM | POA: Diagnosis not present

## 2022-04-12 DIAGNOSIS — N189 Chronic kidney disease, unspecified: Secondary | ICD-10-CM | POA: Diagnosis not present

## 2022-04-12 DIAGNOSIS — N200 Calculus of kidney: Secondary | ICD-10-CM | POA: Diagnosis not present

## 2022-04-12 DIAGNOSIS — I129 Hypertensive chronic kidney disease with stage 1 through stage 4 chronic kidney disease, or unspecified chronic kidney disease: Secondary | ICD-10-CM | POA: Diagnosis not present

## 2022-04-12 DIAGNOSIS — R82992 Hyperoxaluria: Secondary | ICD-10-CM | POA: Diagnosis not present

## 2022-04-12 DIAGNOSIS — R82994 Hypercalciuria: Secondary | ICD-10-CM | POA: Diagnosis not present

## 2022-04-18 ENCOUNTER — Telehealth: Payer: Self-pay

## 2022-04-18 NOTE — Telephone Encounter (Signed)
Patient called the sample of tramadol given to him by Dr Allena Katz worked and could he get full supply of tramadol  Pharmacy: [facility]

## 2022-04-19 ENCOUNTER — Other Ambulatory Visit: Payer: Self-pay | Admitting: Internal Medicine

## 2022-04-19 DIAGNOSIS — E1140 Type 2 diabetes mellitus with diabetic neuropathy, unspecified: Secondary | ICD-10-CM

## 2022-04-19 MED ORDER — TRAMADOL HCL 50 MG PO TABS: 50.0000 mg | ORAL_TABLET | Freq: Two times a day (BID) | ORAL | 0 refills | Status: DC | PRN

## 2022-04-19 NOTE — Telephone Encounter (Signed)
Pt advised with verbal understanding  °

## 2022-04-19 NOTE — Telephone Encounter (Signed)
LVM for pt to call the office.

## 2022-04-26 DIAGNOSIS — E1129 Type 2 diabetes mellitus with other diabetic kidney complication: Secondary | ICD-10-CM | POA: Diagnosis not present

## 2022-04-26 DIAGNOSIS — I129 Hypertensive chronic kidney disease with stage 1 through stage 4 chronic kidney disease, or unspecified chronic kidney disease: Secondary | ICD-10-CM | POA: Diagnosis not present

## 2022-04-26 DIAGNOSIS — E1122 Type 2 diabetes mellitus with diabetic chronic kidney disease: Secondary | ICD-10-CM | POA: Diagnosis not present

## 2022-04-26 DIAGNOSIS — D751 Secondary polycythemia: Secondary | ICD-10-CM | POA: Diagnosis not present

## 2022-04-26 DIAGNOSIS — R809 Proteinuria, unspecified: Secondary | ICD-10-CM | POA: Diagnosis not present

## 2022-04-26 DIAGNOSIS — N200 Calculus of kidney: Secondary | ICD-10-CM | POA: Diagnosis not present

## 2022-04-26 DIAGNOSIS — N189 Chronic kidney disease, unspecified: Secondary | ICD-10-CM | POA: Diagnosis not present

## 2022-04-26 DIAGNOSIS — R82991 Hypocitraturia: Secondary | ICD-10-CM | POA: Diagnosis not present

## 2022-05-17 ENCOUNTER — Ambulatory Visit (INDEPENDENT_AMBULATORY_CARE_PROVIDER_SITE_OTHER): Payer: Medicare HMO | Admitting: Gastroenterology

## 2022-05-17 ENCOUNTER — Encounter: Payer: Self-pay | Admitting: Gastroenterology

## 2022-05-17 VITALS — BP 129/84 | HR 71 | Temp 97.8°F | Ht 74.0 in | Wt 315.6 lb

## 2022-05-17 DIAGNOSIS — K5903 Drug induced constipation: Secondary | ICD-10-CM | POA: Diagnosis not present

## 2022-05-17 DIAGNOSIS — K649 Unspecified hemorrhoids: Secondary | ICD-10-CM

## 2022-05-17 MED ORDER — HYDROCORTISONE (PERIANAL) 2.5 % EX CREA: 1.0000 | TOPICAL_CREAM | Freq: Two times a day (BID) | CUTANEOUS | 1 refills | Status: DC

## 2022-05-17 MED ORDER — POLYETHYLENE GLYCOL 3350 17 GM/SCOOP PO POWD: ORAL | 3 refills | Status: AC
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
## 2022-05-17 NOTE — Patient Instructions (Addendum)
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  You need to start Miralax (polyethylene glycol) powder, one capful twice daily until soft stool, then continue once daily. Hold only if you have more than 2 loose/liquid stools a day.  Hold off on metamucil (psyllium) fiber for now. You can continue to use the hemorrhoid numbing medication when you have discomfort.  I would recommend a steroid hemorrhoid cream to try and calm down the inflammation. I will send in prescription strength. You should use this twice daily just inside the anal opening for 14 days.   If symptoms do not resolve, you may need to have further evaluation with the scope. I will have you come back into the office in 4-6 weeks for follow up. You will see Lewie Loron, NP. If you are still having problems, then she can do an anoscope (look inside the rectum with a little tube) and consider banding of your hemorrhoids.   Hemorrhoids Hemorrhoids are swollen veins in and around the rectum or anus. There are two types of hemorrhoids: Internal hemorrhoids. These occur in the veins that are just inside the rectum. They may poke through to the outside and become irritated and painful. External hemorrhoids. These occur in the veins that are outside the anus and can be felt as a painful swelling or hard lump near the anus. Most hemorrhoids do not cause serious problems, and they can be managed with home treatments such as diet and lifestyle changes. If home treatments do not help the symptoms, procedures can be done to shrink or remove the hemorrhoids. What are the causes? This condition is caused by increased pressure in the anal area. This pressure may result from various things, including: Constipation. Straining to have a bowel movement. Diarrhea. Pregnancy. Obesity. Sitting for long periods of time. Heavy  lifting or other activity that causes you to strain. Anal sex. Riding a bike for a long period of time. What are the signs or symptoms? Symptoms of this condition include: Pain. Anal itching or irritation. Rectal bleeding. Leakage of stool (feces). Anal swelling. One or more lumps around the anus. How is this diagnosed? This condition can often be diagnosed through a visual exam. Other exams or tests may also be done, such as: An exam that involves feeling the rectal area with a gloved hand (digital rectal exam). An exam of the anal canal that is done using a small tube (anoscope). A blood test, if you have lost a significant amount of blood. A test to look inside the colon using a flexible tube with a camera on the end (sigmoidoscopy or colonoscopy). How is this treated? This condition can usually be treated at home. However, various procedures may be done if dietary changes, lifestyle changes, and other home treatments do not help your symptoms. These procedures can help make the hemorrhoids smaller or remove them completely. Some of these procedures involve surgery, and others do not. Common procedures include: Rubber band ligation. Rubber bands are placed at the base of the hemorrhoids to cut off their blood supply. Sclerotherapy. Medicine is injected into the hemorrhoids to shrink them. Infrared coagulation. A type of light energy is used to get rid of the hemorrhoids. Hemorrhoidectomy surgery. The hemorrhoids are surgically removed, and the veins that supply them are tied off. Stapled hemorrhoidopexy surgery. The surgeon staples the base of the hemorrhoid to the rectal wall. Follow these  instructions at home: Eating and drinking  Eat foods that have a lot of fiber in them, such as whole grains, beans, nuts, fruits, and vegetables. Ask your health care provider about taking products that have added fiber (fiber supplements). Reduce the amount of fat in your diet. You can do this by  eating low-fat dairy products, eating less red meat, and avoiding processed foods. Drink enough fluid to keep your urine pale yellow. Managing pain and swelling  Take warm sitz baths for 20 minutes, 3-4 times a day to ease pain and discomfort. You may do this in a bathtub or using a portable sitz bath that fits over the toilet. If directed, apply ice to the affected area. Using ice packs between sitz baths may be helpful. Put ice in a plastic bag. Place a towel between your skin and the bag. Leave the ice on for 20 minutes, 2-3 times a day. General instructions Take over-the-counter and prescription medicines only as told by your health care provider. Use medicated creams or suppositories as told. Get regular exercise. Ask your health care provider how much and what kind of exercise is best for you. In general, you should do moderate exercise for at least 30 minutes on most days of the week (150 minutes each week). This can include activities such as walking, biking, or yoga. Go to the bathroom when you have the urge to have a bowel movement. Do not wait. Avoid straining to have bowel movements. Keep the anal area dry and clean. Use wet toilet paper or moist towelettes after a bowel movement. Do not sit on the toilet for long periods of time. This increases blood pooling and pain. Keep all follow-up visits as told by your health care provider. This is important. Contact a health care provider if you have: Increasing pain and swelling that are not controlled by treatment or medicine. Difficulty having a bowel movement, or you are unable to have a bowel movement. Pain or inflammation outside the area of the hemorrhoids. Get help right away if you have: Uncontrolled bleeding from your rectum. Summary Hemorrhoids are swollen veins in and around the rectum or anus. Most hemorrhoids can be managed with home treatments such as diet and lifestyle changes. Taking warm sitz baths can help ease pain  and discomfort. In severe cases, procedures or surgery can be done to shrink or remove the hemorrhoids. This information is not intended to replace advice given to you by your health care provider. Make sure you discuss any questions you have with your health care provider. Document Revised: 05/11/2021 Document Reviewed: 05/11/2021 Elsevier Patient Education  2023 [employer]   Anal Fissure, Adult  An anal fissure is a small tear or crack in the tissue of the anus. Bleeding from a fissure usually stops on its own within a few minutes. However, bleeding will often occur again with each bowel movement until the fissure heals. What are the causes? This condition is usually caused by passing a large or hard stool (feces). Other causes include: Constipation. Frequent diarrhea. Inflammatory bowel disease (Crohn's disease or ulcerative colitis). Childbirth. Infections. Anal sex. What are the signs or symptoms? Symptoms of this condition include: Bleeding from the rectum. Small amounts of blood seen on your stool, on the toilet paper, or in the toilet after a bowel movement. The blood coats the outside of the stool and is not mixed with the stool. Painful bowel movements. Itching or irritation around the anus. How is this diagnosed? A health care  provider may diagnose this condition by closely examining the anal area. An anal fissure can usually be seen with careful inspection. In some cases, a rectal exam may be performed, or a short tube (anoscope) may be used to examine the anal canal. How is this treated? Initial treatment for this condition may include: Taking steps to avoid constipation. This may include making changes to your diet, such as increasing your intake of fiber or fluid. Taking fiber supplements. These supplements can soften your stool to help make bowel movements easier. Your health care provider may also prescribe a stool softener if your stool is hard. Taking sitz baths.  This may help to heal the tear. Using medicated creams or ointments. These may be prescribed to lessen discomfort. Treatments that are sometimes used if initial treatments do not work well or if the condition is more severe may include: Botulinum injection. Surgery to repair the fissure. Follow these instructions at home: Eating and drinking  Avoid foods that may cause constipation, such as bananas, milk, and other dairy products. Eat all fruits, except bananas. Drink enough fluid to keep your urine pale yellow. Eat foods that are high in fiber, such as beans, whole grains, and fresh fruits and vegetables. General instructions  Take over-the-counter and prescription medicines only as told by your health care provider. Use creams or ointments only as told by your health care provider. Keep the anal area clean and dry. Take sitz baths as told by your health care provider. Do not use soap in the sitz baths. Keep all follow-up visits as told by your health care provider. This is important. Contact a health care provider if you have: More bleeding. A fever. Diarrhea that is mixed with blood. Pain that continues. Ongoing problems that are getting worse rather than better. Summary An anal fissure is a small tear or crack in the tissue of the anus. This condition is usually caused by passing a large or hard stool (feces). Other causes include constipation and frequent diarrhea. Initial treatment for this condition may include taking steps to avoid constipation, such as increasing your intake of fiber or fluid. Follow instructions for care as told by your health care provider. Contact your health care provider if you have more bleeding or your problem is getting worse rather than better. Keep all follow-up visits as told by your health care provider. This is important. This information is not intended to replace advice given to you by your health care provider. Make sure you discuss any  questions you have with your health care provider. Document Revised: 05/26/2021 Document Reviewed: 05/26/2021 Elsevier Patient Education  2023 [employer].

## 2022-05-20 ENCOUNTER — Other Ambulatory Visit: Payer: Self-pay | Admitting: Internal Medicine

## 2022-05-20 DIAGNOSIS — E1140 Type 2 diabetes mellitus with diabetic neuropathy, unspecified: Secondary | ICD-10-CM

## 2022-06-05 DIAGNOSIS — E1129 Type 2 diabetes mellitus with other diabetic kidney complication: Secondary | ICD-10-CM | POA: Diagnosis not present

## 2022-06-05 DIAGNOSIS — I129 Hypertensive chronic kidney disease with stage 1 through stage 4 chronic kidney disease, or unspecified chronic kidney disease: Secondary | ICD-10-CM | POA: Diagnosis not present

## 2022-06-05 DIAGNOSIS — R82991 Hypocitraturia: Secondary | ICD-10-CM | POA: Diagnosis not present

## 2022-06-05 DIAGNOSIS — E1122 Type 2 diabetes mellitus with diabetic chronic kidney disease: Secondary | ICD-10-CM | POA: Diagnosis not present

## 2022-06-05 DIAGNOSIS — N189 Chronic kidney disease, unspecified: Secondary | ICD-10-CM | POA: Diagnosis not present

## 2022-06-05 DIAGNOSIS — D751 Secondary polycythemia: Secondary | ICD-10-CM | POA: Diagnosis not present

## 2022-06-05 DIAGNOSIS — N200 Calculus of kidney: Secondary | ICD-10-CM | POA: Diagnosis not present

## 2022-06-05 DIAGNOSIS — R809 Proteinuria, unspecified: Secondary | ICD-10-CM | POA: Diagnosis not present

## 2022-06-15 ENCOUNTER — Ambulatory Visit (INDEPENDENT_AMBULATORY_CARE_PROVIDER_SITE_OTHER): Payer: Medicare HMO | Admitting: Internal Medicine

## 2022-06-15 ENCOUNTER — Telehealth: Payer: Self-pay | Admitting: Internal Medicine

## 2022-06-15 ENCOUNTER — Encounter: Payer: Self-pay | Admitting: Internal Medicine

## 2022-06-15 DIAGNOSIS — E1140 Type 2 diabetes mellitus with diabetic neuropathy, unspecified: Secondary | ICD-10-CM

## 2022-06-15 MED ORDER — PREGABALIN 75 MG PO CAPS: 75.0000 mg | ORAL_CAPSULE | Freq: Two times a day (BID) | ORAL | 2 refills | Status: DC

## 2022-06-15 NOTE — Telephone Encounter (Signed)
Pt called wanted to know if the dosage on pregabalin (LYRICA) 50 MG capsule the  can be increase?

## 2022-06-15 NOTE — Patient Instructions (Signed)
Please start taking Lyrica 75 mg twice daily.  Okay to take Lyrica 50 mg every morning and 100 mg every morning until you complete the 50 mg dose.

## 2022-06-15 NOTE — Assessment & Plan Note (Signed)
Lab Results  Component Value Date   HGBA1C 6.9 08/04/2021   On Jardiance 10 mg QD Advised to follow diabetic diet On statin and ARB F/u CMP and lipid panel Diabetic eye exam: Advised to follow up with Ophthalmology for diabetic eye exam  Increased dose of Lyrica to 75 mg BID for DM neuropathy
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
## 2022-06-15 NOTE — Telephone Encounter (Signed)
Patient will need an appointment. Can be virtual today if able

## 2022-06-19 DIAGNOSIS — E559 Vitamin D deficiency, unspecified: Secondary | ICD-10-CM | POA: Diagnosis not present

## 2022-06-19 DIAGNOSIS — N1831 Chronic kidney disease, stage 3a: Secondary | ICD-10-CM | POA: Diagnosis not present

## 2022-06-19 DIAGNOSIS — E782 Mixed hyperlipidemia: Secondary | ICD-10-CM | POA: Diagnosis not present

## 2022-06-19 DIAGNOSIS — E1140 Type 2 diabetes mellitus with diabetic neuropathy, unspecified: Secondary | ICD-10-CM | POA: Diagnosis not present

## 2022-06-19 DIAGNOSIS — I10 Essential (primary) hypertension: Secondary | ICD-10-CM | POA: Diagnosis not present

## 2022-06-20 LAB — CMP14+EGFR
ALT: 23 IU/L (ref 0–44)
AST: 14 IU/L (ref 0–40)
Albumin/Globulin Ratio: 1.9 (ref 1.2–2.2)
Albumin: 4.6 g/dL (ref 4.1–5.1)
Alkaline Phosphatase: 71 IU/L (ref 44–121)
BUN/Creatinine Ratio: 13 (ref 9–20)
BUN: 21 mg/dL (ref 6–24)
Bilirubin Total: 0.4 mg/dL (ref 0.0–1.2)
CO2: 23 mmol/L (ref 20–29)
Calcium: 9.5 mg/dL (ref 8.7–10.2)
Chloride: 99 mmol/L (ref 96–106)
Creatinine, Ser: 1.59 mg/dL — ABNORMAL HIGH (ref 0.76–1.27)
Globulin, Total: 2.4 g/dL (ref 1.5–4.5)
Glucose: 187 mg/dL — ABNORMAL HIGH (ref 70–99)
Potassium: 4.2 mmol/L (ref 3.5–5.2)
Sodium: 139 mmol/L (ref 134–144)
Total Protein: 7 g/dL (ref 6.0–8.5)
eGFR: 53 mL/min/{1.73_m2} — ABNORMAL LOW (ref >59)

## 2022-06-20 LAB — CBC WITH DIFFERENTIAL/PLATELET
Basophils Absolute: 0.1 10*3/uL (ref 0.0–0.2)
Basos: 1 %
EOS (ABSOLUTE): 0.2 10*3/uL (ref 0.0–0.4)
Eos: 3 %
Hematocrit: 52.1 % — ABNORMAL HIGH (ref 37.5–51.0)
Hemoglobin: 17.6 g/dL (ref 13.0–17.7)
Immature Grans (Abs): 0 10*3/uL (ref 0.0–0.1)
Immature Granulocytes: 1 %
Lymphocytes Absolute: 1.7 10*3/uL (ref 0.7–3.1)
Lymphs: 27 %
MCH: 28.4 pg (ref 26.6–33.0)
MCHC: 33.8 g/dL (ref 31.5–35.7)
MCV: 84 fL (ref 79–97)
Monocytes Absolute: 0.5 10*3/uL (ref 0.1–0.9)
Monocytes: 8 %
Neutrophils Absolute: 3.8 10*3/uL (ref 1.4–7.0)
Neutrophils: 60 %
Platelets: 216 10*3/uL (ref 150–450)
RBC: 6.2 x10E6/uL — ABNORMAL HIGH (ref 4.14–5.80)
RDW: 13.9 % (ref 11.6–15.4)
WBC: 6.3 10*3/uL (ref 3.4–10.8)

## 2022-06-20 LAB — LIPID PANEL
Chol/HDL Ratio: 5.2 ratio — ABNORMAL HIGH (ref 0.0–5.0)
Cholesterol, Total: 183 mg/dL (ref 100–199)
HDL: 35 mg/dL — ABNORMAL LOW (ref >39)
LDL Chol Calc (NIH): 94 mg/dL (ref 0–99)
Triglycerides: 324 mg/dL — ABNORMAL HIGH (ref 0–149)
VLDL Cholesterol Cal: 54 mg/dL — ABNORMAL HIGH (ref 5–40)

## 2022-06-20 LAB — HEMOGLOBIN A1C
Est. average glucose Bld gHb Est-mCnc: 200 mg/dL
Hgb A1c MFr Bld: 8.6 % — ABNORMAL HIGH (ref 4.8–5.6)

## 2022-06-20 LAB — TSH: TSH: 0.618 u[IU]/mL (ref 0.450–4.500)

## 2022-06-20 LAB — VITAMIN D 25 HYDROXY (VIT D DEFICIENCY, FRACTURES): Vit D, 25-Hydroxy: 37.3 ng/mL (ref 30.0–100.0)

## 2022-07-03 ENCOUNTER — Ambulatory Visit (INDEPENDENT_AMBULATORY_CARE_PROVIDER_SITE_OTHER): Payer: Medicare HMO | Admitting: Internal Medicine

## 2022-07-03 ENCOUNTER — Encounter: Payer: Self-pay | Admitting: Internal Medicine

## 2022-07-03 VITALS — BP 121/78 | HR 72 | Ht 74.0 in | Wt 322.8 lb

## 2022-07-03 DIAGNOSIS — E1140 Type 2 diabetes mellitus with diabetic neuropathy, unspecified: Secondary | ICD-10-CM

## 2022-07-03 DIAGNOSIS — Z23 Encounter for immunization: Secondary | ICD-10-CM

## 2022-07-03 DIAGNOSIS — E782 Mixed hyperlipidemia: Secondary | ICD-10-CM | POA: Diagnosis not present

## 2022-07-03 DIAGNOSIS — Z0001 Encounter for general adult medical examination with abnormal findings: Secondary | ICD-10-CM

## 2022-07-03 DIAGNOSIS — N1831 Chronic kidney disease, stage 3a: Secondary | ICD-10-CM | POA: Diagnosis not present

## 2022-07-03 DIAGNOSIS — I10 Essential (primary) hypertension: Secondary | ICD-10-CM

## 2022-07-03 MED ORDER — PREGABALIN 50 MG PO CAPS: 50.0000 mg | ORAL_CAPSULE | Freq: Two times a day (BID) | ORAL | 3 refills | Status: DC

## 2022-07-03 MED ORDER — ROSUVASTATIN CALCIUM 20 MG PO TABS: 20.0000 mg | ORAL_TABLET | Freq: Every day | ORAL | 3 refills | Status: DC

## 2022-07-03 MED ORDER — EMPAGLIFLOZIN 25 MG PO TABS: 25.0000 mg | ORAL_TABLET | Freq: Every day | ORAL | 5 refills | Status: DC
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
## 2022-07-03 NOTE — Patient Instructions (Addendum)
Please take Jardiance 25 mg instead of 10 mg.  Please take Lyrica 50 mg twice daily instead of 75 mg.  Please take fish oil 2000 mg twice daily.  Please continue taking other medications as prescribed.  Please continue to take at least 64 ounces of fluid in a day.  Please follow low carb diet and perform moderate exercise/walking at least 150 mins/week.  Please consider getting Shingrix and Tdap vaccines at local pharmacy.

## 2022-07-03 NOTE — Assessment & Plan Note (Addendum)
Lab Results  Component Value Date   HGBA1C 8.6 (H) 06/19/2022   Uncontrolled due to diet noncompliance On Jardiance 10 mg QD, increased dose to 25 mg QD Advised to follow diabetic diet On statin and ARB F/u CMP and lipid panel Diabetic eye exam: Advised to follow up with Ophthalmology for diabetic eye exam  Decreased dose of Lyrica to 50 mg BID for DM neuropathy as he had constipation with higher dose and did not see much difference in pain control Tramadol PRN for pain

## 2022-07-06 ENCOUNTER — Encounter: Payer: Medicare HMO | Admitting: Gastroenterology

## 2022-07-07 DIAGNOSIS — Z0001 Encounter for general adult medical examination with abnormal findings: Secondary | ICD-10-CM | POA: Insufficient documentation

## 2022-07-07 DIAGNOSIS — Z23 Encounter for immunization: Secondary | ICD-10-CM | POA: Insufficient documentation

## 2022-07-07 NOTE — Assessment & Plan Note (Signed)

## 2022-07-07 NOTE — Assessment & Plan Note (Signed)
BP Readings from Last 1 Encounters:  07/03/22 121/78   Well controlled with losartan 50 mg QD Counseled for compliance with the medications Advised DASH diet and moderate exercise/walking, at least 150 mins/week

## 2022-07-07 NOTE — Assessment & Plan Note (Signed)
On Crestor Checked lipid profile 

## 2022-07-07 NOTE — Assessment & Plan Note (Signed)

## 2022-07-07 NOTE — Assessment & Plan Note (Signed)
Likely due to underlying DM and HTN Followed by Nephrology Avoid nephrotoxic agents On Losartan and Jardiance 

## 2022-08-08 ENCOUNTER — Other Ambulatory Visit: Payer: Self-pay

## 2022-08-08 ENCOUNTER — Ambulatory Visit (INDEPENDENT_AMBULATORY_CARE_PROVIDER_SITE_OTHER): Payer: Medicare HMO | Admitting: Nurse Practitioner

## 2022-08-08 ENCOUNTER — Encounter: Payer: Self-pay | Admitting: Nurse Practitioner

## 2022-08-08 DIAGNOSIS — E1140 Type 2 diabetes mellitus with diabetic neuropathy, unspecified: Secondary | ICD-10-CM

## 2022-08-08 DIAGNOSIS — Z0000 Encounter for general adult medical examination without abnormal findings: Secondary | ICD-10-CM | POA: Diagnosis not present

## 2022-08-08 IMAGING — US US RENAL
1 series · 14 of 25 positions shown · non-contrast
Comparison: None.

CLINICAL DATA: Hematuria.

EXAM:
RENAL / URINARY TRACT ULTRASOUND COMPLETE

[Series 1: us renal · 14 of 43 slices shown]
[im 1/43]
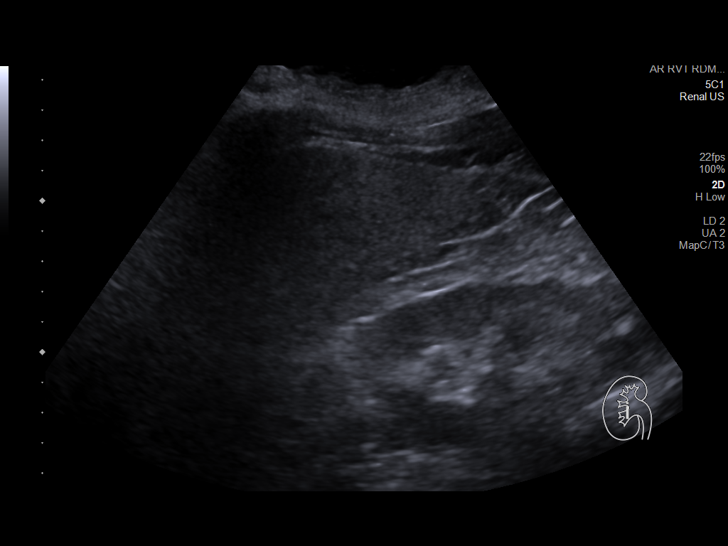
[im 4/43]
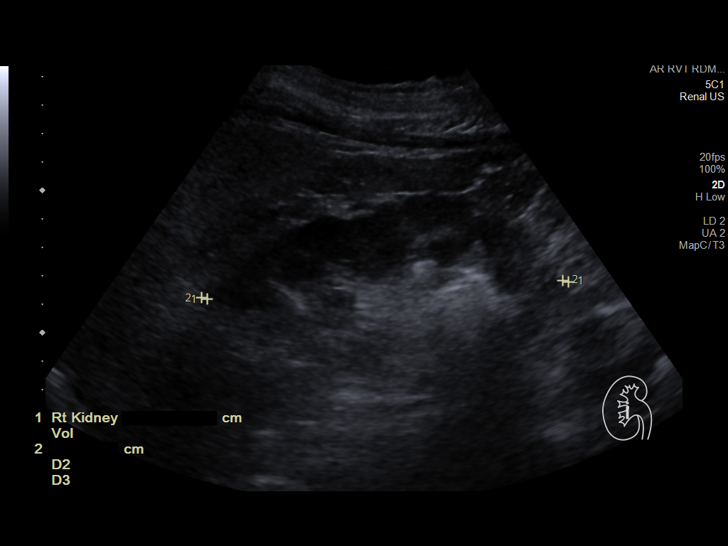
[im 8/43]
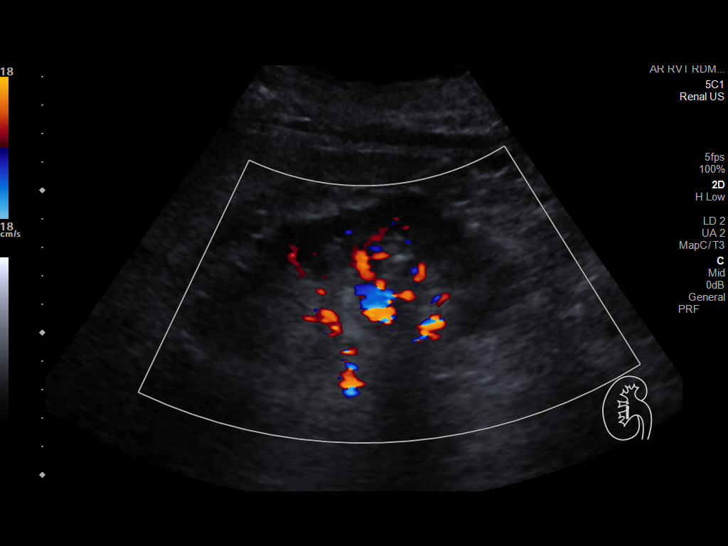
[im 11/43]
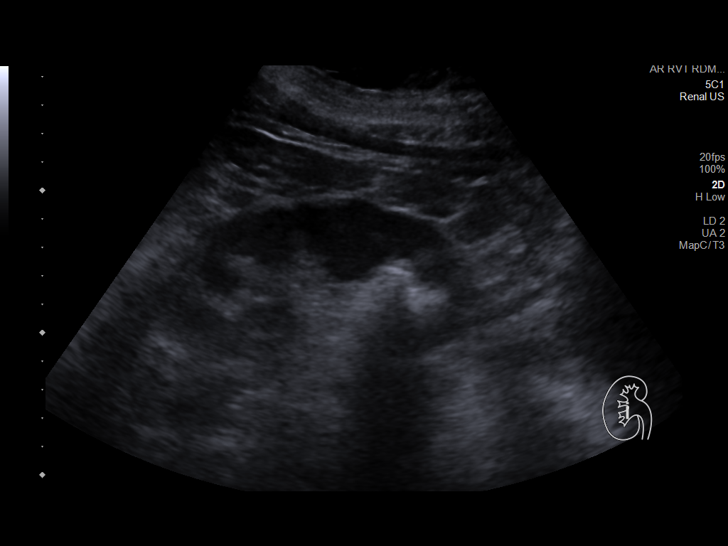
[im 15/43]
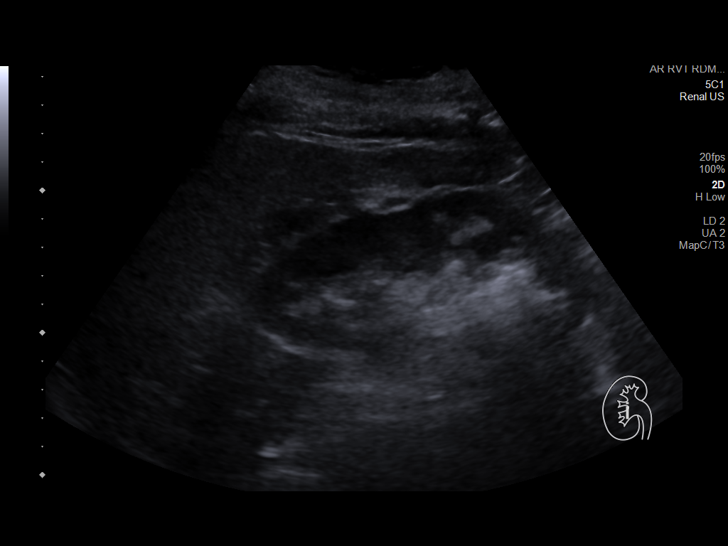
[im 16/43]
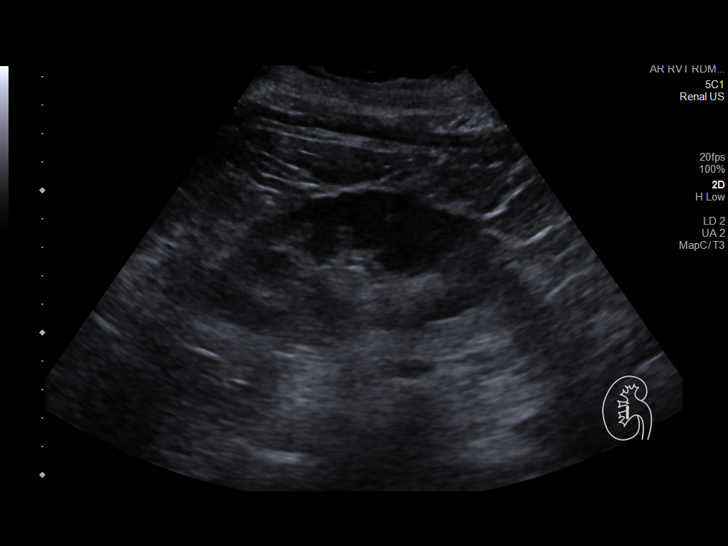
[im 20/43]
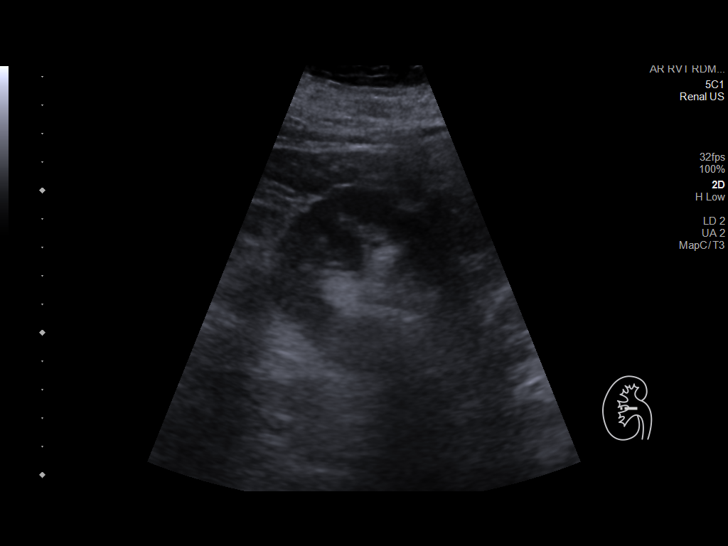
[im 23/43]
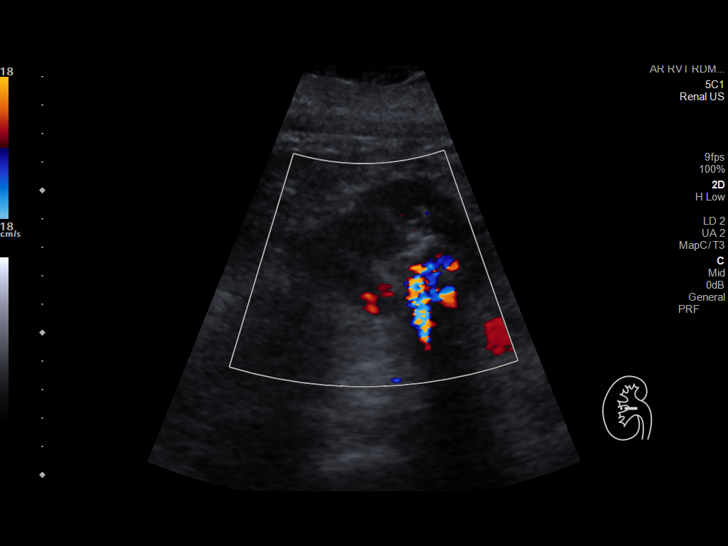
[im 27/43]
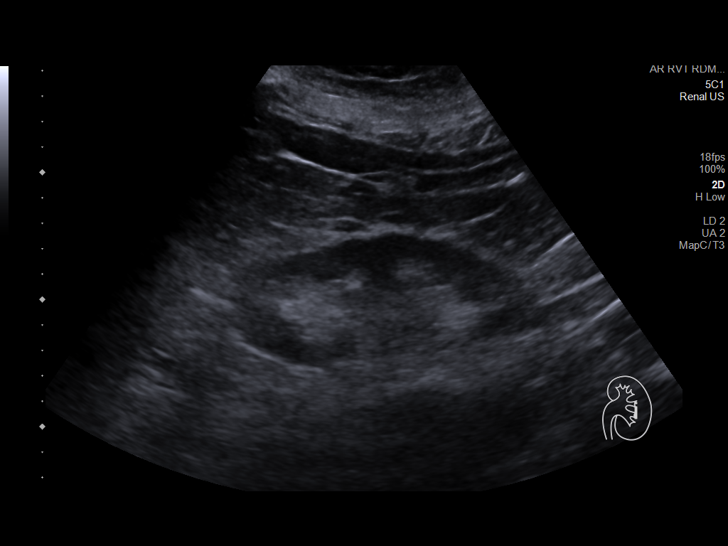
[im 29/43]
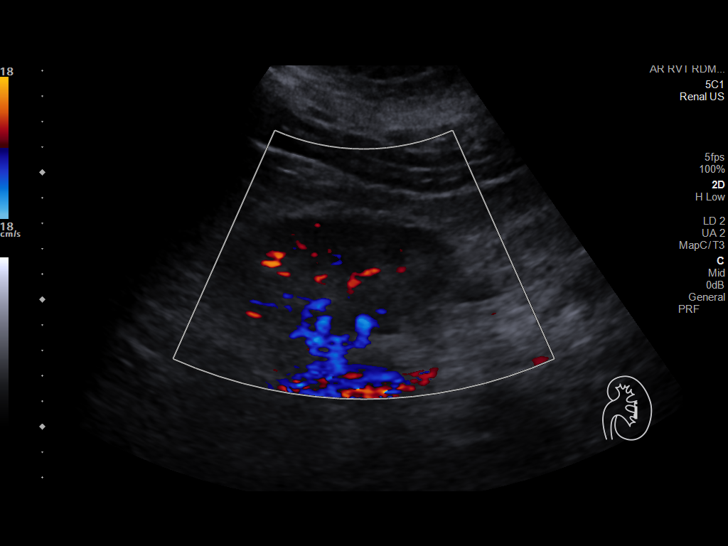
[im 32/43]
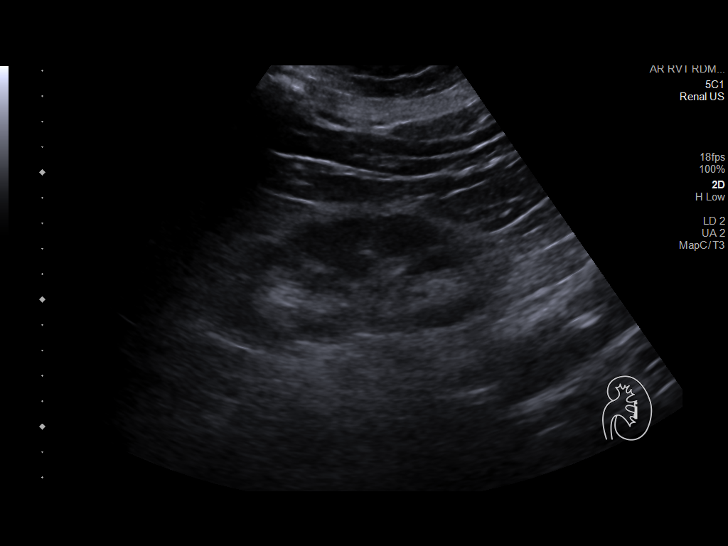
[im 36/43]
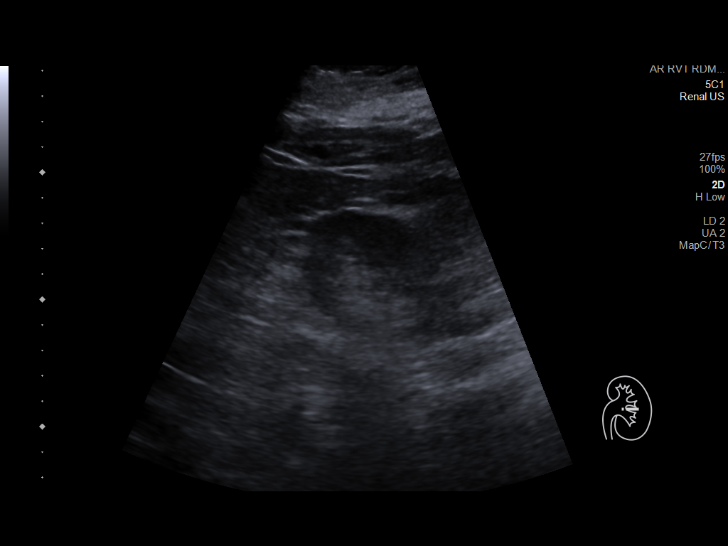
[im 39/43]
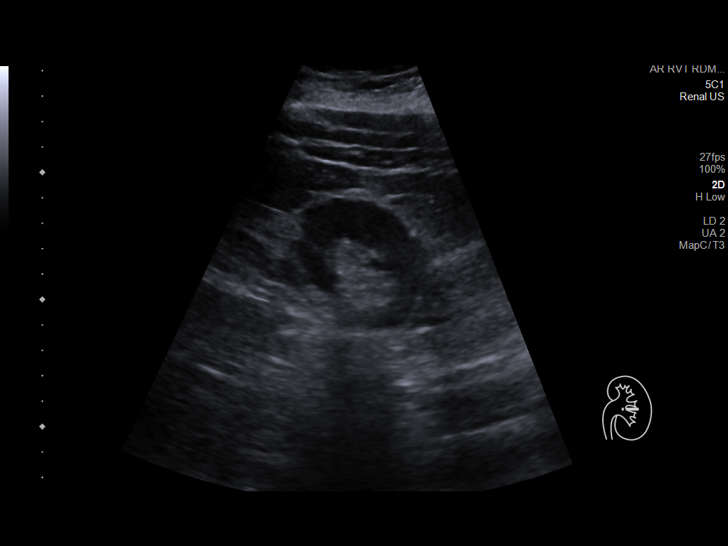
[im 43/43]
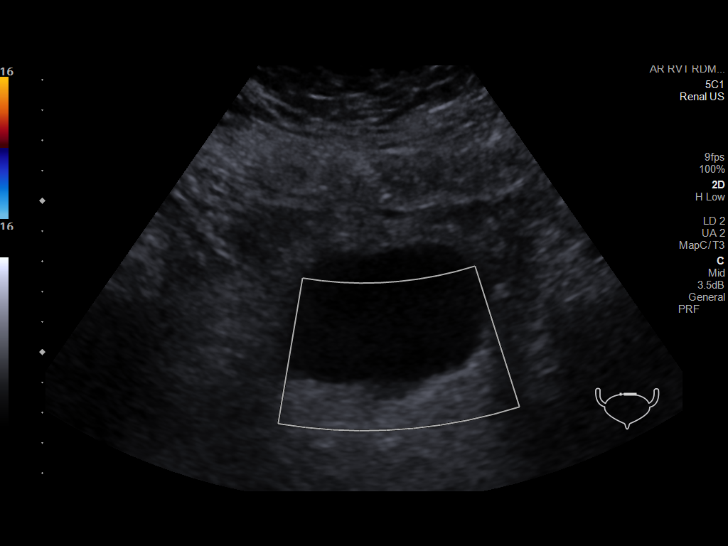

[14 of 25 positions shown; findings below may reference images not displayed]

FINDINGS: Right Kidney:

Renal measurements: 12.7 x 7.0 x 7.5 cm = volume: 348 mL. At least 2
nonobstructive stones are seen in the right kidney with the largest
measuring 14 mm.

Left Kidney:

Renal measurements: 12.2 x 5.4 x 6.1 cm = volume: 210 mL.
Echogenicity within normal limits. No mass or hydronephrosis
visualized.

Bladder:

Appears normal for degree of bladder distention.

Other:

None.
IMPRESSION: Nonobstructive right renal stones. The largest measured stone
measures 14 mm. No other abnormalities.

## 2022-08-08 MED ORDER — BLOOD GLUCOSE METER KIT: PACK | 0 refills | Status: DC
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
## 2022-08-08 NOTE — Patient Instructions (Addendum)
  Francisco Huffman , Thank you for taking time to come for your Medicare Wellness Visit. I appreciate your ongoing commitment to your health goals. Please review the following plan we discussed and let me know if I can assist you in the future.   These are the goals we discussed:  Goals      Increase physical activity     I recommended that you you engage in regular daily walking exercises at least 5 days , 150 minutes weekly as tolerated. Monitor your blood sugar twice daily. .Goal for fasting blood sugar ranges from 80 to 120 and 2 hours after any meal or at bedtime should be between 130 to 170.         This is a list of the screening recommended for you and due dates:  Health Maintenance  Topic Date Due   COVID-19 Vaccine (1) Never done   Tetanus Vaccine  Never done   Zoster (Shingles) Vaccine (1 of 2) Never done   Eye exam for diabetics  11/03/2022   Hemoglobin A1C  12/20/2022   Yearly kidney health urinalysis for diabetes  06/06/2023   Yearly kidney function blood test for diabetes  06/20/2023   Complete foot exam   07/04/2023   Cologuard (Stool DNA test)  11/10/2024   Flu Shot  Completed   Hepatitis C Screening: USPSTF Recommendation to screen - Ages 18-79 yo.  Completed   HIV Screening  Completed   HPV Vaccine  Aged Out

## 2022-08-08 NOTE — Progress Notes (Signed)
glu

## 2022-08-11 NOTE — Addendum Note (Signed)
Addended by: Shakoya Gilmore H on: 08/11/2022 01:53 PM   Modules accepted: Orders, Level of Service  

## 2022-08-14 DIAGNOSIS — E1122 Type 2 diabetes mellitus with diabetic chronic kidney disease: Secondary | ICD-10-CM | POA: Diagnosis not present

## 2022-08-14 DIAGNOSIS — E1129 Type 2 diabetes mellitus with other diabetic kidney complication: Secondary | ICD-10-CM | POA: Diagnosis not present

## 2022-08-14 DIAGNOSIS — N189 Chronic kidney disease, unspecified: Secondary | ICD-10-CM | POA: Diagnosis not present

## 2022-08-14 DIAGNOSIS — N200 Calculus of kidney: Secondary | ICD-10-CM | POA: Diagnosis not present

## 2022-08-14 DIAGNOSIS — D751 Secondary polycythemia: Secondary | ICD-10-CM | POA: Diagnosis not present

## 2022-08-14 DIAGNOSIS — R82991 Hypocitraturia: Secondary | ICD-10-CM | POA: Diagnosis not present

## 2022-08-14 DIAGNOSIS — I129 Hypertensive chronic kidney disease with stage 1 through stage 4 chronic kidney disease, or unspecified chronic kidney disease: Secondary | ICD-10-CM | POA: Diagnosis not present

## 2022-08-14 DIAGNOSIS — R82994 Hypercalciuria: Secondary | ICD-10-CM | POA: Diagnosis not present

## 2022-08-14 DIAGNOSIS — R809 Proteinuria, unspecified: Secondary | ICD-10-CM | POA: Diagnosis not present

## 2022-08-28 DIAGNOSIS — R82991 Hypocitraturia: Secondary | ICD-10-CM | POA: Diagnosis not present

## 2022-08-28 DIAGNOSIS — E1129 Type 2 diabetes mellitus with other diabetic kidney complication: Secondary | ICD-10-CM | POA: Diagnosis not present

## 2022-08-28 DIAGNOSIS — R809 Proteinuria, unspecified: Secondary | ICD-10-CM | POA: Diagnosis not present

## 2022-08-28 DIAGNOSIS — N200 Calculus of kidney: Secondary | ICD-10-CM | POA: Diagnosis not present

## 2022-08-28 DIAGNOSIS — I129 Hypertensive chronic kidney disease with stage 1 through stage 4 chronic kidney disease, or unspecified chronic kidney disease: Secondary | ICD-10-CM | POA: Diagnosis not present

## 2022-08-28 DIAGNOSIS — R82994 Hypercalciuria: Secondary | ICD-10-CM | POA: Diagnosis not present

## 2022-08-28 DIAGNOSIS — N189 Chronic kidney disease, unspecified: Secondary | ICD-10-CM | POA: Diagnosis not present

## 2022-08-28 DIAGNOSIS — E1122 Type 2 diabetes mellitus with diabetic chronic kidney disease: Secondary | ICD-10-CM | POA: Diagnosis not present

## 2022-08-30 ENCOUNTER — Other Ambulatory Visit: Payer: Self-pay | Admitting: Internal Medicine

## 2022-08-30 ENCOUNTER — Telehealth: Payer: Self-pay

## 2022-08-30 ENCOUNTER — Ambulatory Visit: Payer: Medicare HMO | Admitting: Internal Medicine

## 2022-08-30 DIAGNOSIS — N522 Drug-induced erectile dysfunction: Secondary | ICD-10-CM

## 2022-08-30 NOTE — Telephone Encounter (Signed)
Patient called said the nurse that he spoke to in his annual wellness visit about doing a blood test. Please contact patient back [phone].

## 2022-08-30 NOTE — Telephone Encounter (Signed)
Spoke with pt he said he spoke with insurance [employer] and they told him about a blood level test pt will reach out to insurance

## 2022-08-31 ENCOUNTER — Ambulatory Visit (INDEPENDENT_AMBULATORY_CARE_PROVIDER_SITE_OTHER): Payer: Medicare HMO | Admitting: Internal Medicine

## 2022-08-31 ENCOUNTER — Encounter: Payer: Self-pay | Admitting: Internal Medicine

## 2022-08-31 VITALS — BP 124/72 | HR 73 | Ht 74.0 in | Wt 320.0 lb

## 2022-08-31 DIAGNOSIS — S025XXA Fracture of tooth (traumatic), initial encounter for closed fracture: Secondary | ICD-10-CM

## 2022-08-31 DIAGNOSIS — E1140 Type 2 diabetes mellitus with diabetic neuropathy, unspecified: Secondary | ICD-10-CM | POA: Diagnosis not present

## 2022-08-31 DIAGNOSIS — L0882 Omphalitis not of newborn: Secondary | ICD-10-CM

## 2022-08-31 DIAGNOSIS — S0993XA Unspecified injury of face, initial encounter: Secondary | ICD-10-CM | POA: Diagnosis not present

## 2022-08-31 MED ORDER — BLOOD GLUCOSE METER KIT: PACK | 0 refills | Status: DC

## 2022-08-31 NOTE — Assessment & Plan Note (Signed)
Lab Results  Component Value Date   HGBA1C 8.6 (H) 06/19/2022   Uncontrolled due to diet noncompliance On Jardiance 25 mg QD Advised to follow diabetic diet On statin and ARB F/u CMP and lipid panel Diabetic eye exam: Advised to follow up with Ophthalmology for diabetic eye exam  On Lyrica to 50 mg BID for DM neuropathy as he had constipation with higher dose and did not see much difference in pain control Tramadol PRN for pain

## 2022-08-31 NOTE — Patient Instructions (Signed)
Please continue taking medications as prescribed.  Please get dental and oral evaluation for lip and teeth injury.

## 2022-08-31 NOTE — Assessment & Plan Note (Signed)
Serous/mucoid with intermittent blood tinged discharge from umbilical area Lotrisone cream prescribed Has had recurrent discharge Referred to dermatology
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
## 2022-08-31 NOTE — Assessment & Plan Note (Signed)
Likely related to broken tooth Referred to oral surgeon

## 2022-09-05 ENCOUNTER — Other Ambulatory Visit: Payer: Self-pay | Admitting: *Deleted

## 2022-09-05 DIAGNOSIS — E1140 Type 2 diabetes mellitus with diabetic neuropathy, unspecified: Secondary | ICD-10-CM

## 2022-09-05 MED ORDER — BLOOD GLUCOSE METER KIT: PACK | 0 refills | Status: AC

## 2022-09-20 ENCOUNTER — Other Ambulatory Visit: Payer: Self-pay | Admitting: Internal Medicine

## 2022-09-20 ENCOUNTER — Telehealth: Payer: Self-pay | Admitting: Internal Medicine

## 2022-09-20 DIAGNOSIS — E1140 Type 2 diabetes mellitus with diabetic neuropathy, unspecified: Secondary | ICD-10-CM

## 2022-09-20 MED ORDER — TRAMADOL HCL 50 MG PO TABS: 50.0000 mg | ORAL_TABLET | Freq: Two times a day (BID) | ORAL | 0 refills | Status: DC | PRN

## 2022-09-20 NOTE — Telephone Encounter (Signed)
  Prescription Request  09/20/2022  Is this a "Controlled Substance" medicine? No  LOV: 08/31/2022   What is the name of the medication or equipment? traMADol (ULTRAM) 50 MG tablet   Have you contacted your pharmacy to request a refill? No   Which pharmacy would you like this sent to?  [id number] [place], [place] - [address] [facility] [address] [place] [address] Phone: [phone] Fax: [phone]  [facility] [address] [place] - [address] [place] [address] Phone: [phone] Fax: [phone]   Patient notified that their request is being sent to the clinical staff for review and that they should receive a response within 2 business days.   Please advise at [phone] (mobile)

## 2022-09-29 DIAGNOSIS — K130 Diseases of lips: Secondary | ICD-10-CM | POA: Diagnosis not present

## 2022-10-11 ENCOUNTER — Telehealth: Payer: Self-pay | Admitting: Internal Medicine

## 2022-10-11 NOTE — Telephone Encounter (Signed)
Pt called stating he is running a fever (102.8), nausea & vomiting, unable to eat/drink. He is wanting to know what he needs to do about taking his medications since he can't keep anything on his stomach.

## 2022-10-12 ENCOUNTER — Encounter: Payer: Self-pay | Admitting: Internal Medicine

## 2022-10-12 ENCOUNTER — Ambulatory Visit (INDEPENDENT_AMBULATORY_CARE_PROVIDER_SITE_OTHER): Payer: Medicare HMO | Admitting: Internal Medicine

## 2022-10-12 DIAGNOSIS — B349 Viral infection, unspecified: Secondary | ICD-10-CM

## 2022-10-12 NOTE — Progress Notes (Signed)
   Acute Telephone Visit  Virtual Visit via Telephone Note  I connected with Francisco Huffman on 10/12/22 at  2:40 PM EST by telephone and verified that I am speaking with the correct person using two identifiers.  Chief Complaint  Patient presents with   virus   Location: Patient: [address] [place], [place] [address] Provider: [phone] S. [address], [place] [address]   I discussed the limitations, risks, security and privacy concerns of performing an evaluation and management service by telephone and the availability of in person appointments. I also discussed with the patient that there may be a patient responsible charge related to this service. The patient expressed understanding and agreed to proceed.  History of Present Illness:  Mr. Francisco Huffman has been evaluated today via acute telephone encounter for a 4-day history of symptoms concerning for viral infection.  He endorses a fever of 102.79F in addition to nausea with vomiting, myalgias, diarrhea, a dry cough, and loss of appetite.  He believes he has a viral infection as he knows several other people that are recently been sick.  Mr. Francisco Huffman is vaccinated against influenza but did not receive COVID-19 vaccines.  Since developing symptoms of his own, his girlfriend and daughter have also developed similar symptoms.  He has not been taking any medication for symptom relief but is interested in receiving a Z-Pak today as a friend told him that this might be helpful.  Assessment and Plan:  Viral Infection Evaluation today for a 4-day history of the symptoms noted above, which seem most consistent with viral infection.  I offered COVID/influenza testing today but he has declined for now.  Given that his symptoms seem most consistent with a viral infection, we discussed that a Z-Pak would not be beneficial.  I recommended that he stay well-hydrated, particularly in the setting of nausea vomiting and diarrhea.  I have also recommended as needed use of Tylenol  and a cough suppressant.  He reports that he will notify us tomorrow if his symptoms worsen.  I also recommended that he present for in person evaluation if he continues to fever and his cough developed sputum production.  Follow Up Instructions:  I discussed the assessment and treatment plan with the patient. The patient was provided an opportunity to ask questions and all were answered. The patient agreed with the plan and demonstrated an understanding of the instructions.   The patient was advised to call back or seek an in-person evaluation if the symptoms worsen or if the condition fails to improve as anticipated.  I provided 13 minutes of non-face-to-face time during this encounter.   Billie Lade, MD

## 2022-10-14 DIAGNOSIS — U071 COVID-19: Secondary | ICD-10-CM | POA: Diagnosis not present

## 2022-10-14 DIAGNOSIS — R070 Pain in throat: Secondary | ICD-10-CM | POA: Diagnosis not present

## 2022-10-14 DIAGNOSIS — R059 Cough, unspecified: Secondary | ICD-10-CM | POA: Diagnosis not present

## 2022-10-14 DIAGNOSIS — Z20822 Contact with and (suspected) exposure to covid-19: Secondary | ICD-10-CM | POA: Diagnosis not present

## 2022-10-16 ENCOUNTER — Telehealth: Payer: Self-pay | Admitting: Internal Medicine

## 2022-10-16 NOTE — Telephone Encounter (Signed)
Patient called to see if he can take covid medication with his regular medication. Please contact patient.

## 2022-10-17 NOTE — Telephone Encounter (Signed)
Left voice mail

## 2022-10-25 ENCOUNTER — Other Ambulatory Visit: Payer: Self-pay | Admitting: Internal Medicine

## 2022-10-25 DIAGNOSIS — E1140 Type 2 diabetes mellitus with diabetic neuropathy, unspecified: Secondary | ICD-10-CM

## 2022-11-08 ENCOUNTER — Encounter: Payer: Self-pay | Admitting: Internal Medicine

## 2022-11-08 ENCOUNTER — Ambulatory Visit (INDEPENDENT_AMBULATORY_CARE_PROVIDER_SITE_OTHER): Payer: Medicare HMO | Admitting: Internal Medicine

## 2022-11-08 VITALS — BP 113/76 | HR 74 | Ht 74.0 in | Wt 310.6 lb

## 2022-11-08 DIAGNOSIS — E1140 Type 2 diabetes mellitus with diabetic neuropathy, unspecified: Secondary | ICD-10-CM | POA: Diagnosis not present

## 2022-11-08 DIAGNOSIS — L0882 Omphalitis not of newborn: Secondary | ICD-10-CM

## 2022-11-08 DIAGNOSIS — R110 Nausea: Secondary | ICD-10-CM

## 2022-11-08 DIAGNOSIS — I10 Essential (primary) hypertension: Secondary | ICD-10-CM

## 2022-11-08 DIAGNOSIS — G4700 Insomnia, unspecified: Secondary | ICD-10-CM | POA: Diagnosis not present

## 2022-11-08 DIAGNOSIS — N1831 Chronic kidney disease, stage 3a: Secondary | ICD-10-CM | POA: Diagnosis not present

## 2022-11-08 LAB — POCT GLYCOSYLATED HEMOGLOBIN (HGB A1C): HbA1c, POC (controlled diabetic range): 8.2 % — AB (ref 0.0–7.0)

## 2022-11-08 MED ORDER — CLOTRIMAZOLE-BETAMETHASONE 1-0.05 % EX CREA: 1.0000 | TOPICAL_CREAM | Freq: Every day | CUTANEOUS | 0 refills | Status: AC

## 2022-11-08 MED ORDER — ONDANSETRON HCL 4 MG PO TABS: 4.0000 mg | ORAL_TABLET | Freq: Three times a day (TID) | ORAL | 0 refills | Status: DC | PRN

## 2022-11-08 MED ORDER — TIRZEPATIDE 2.5 MG/0.5ML ~~LOC~~ SOAJ: 2.5000 mg | SUBCUTANEOUS | 0 refills | Status: DC

## 2022-11-08 NOTE — Patient Instructions (Signed)
Please start taking Mounjaro as prescribed.  Please follow small, frequent meals to avoid nausea.  Please follow low carb diet and perform moderate exercise/walking at least 150 mins/week.

## 2022-11-08 NOTE — Assessment & Plan Note (Signed)
BP Readings from Last 1 Encounters:  11/08/22 113/76   Well controlled with losartan 50 mg QD Counseled for compliance with the medications Advised DASH diet and moderate exercise/walking, at least 150 mins/week

## 2022-11-08 NOTE — Assessment & Plan Note (Addendum)
Lab Results  Component Value Date   HGBA1C 8.6 (H) 06/19/2022   Uncontrolled due to diet noncompliance On Jardiance 25 mg QD Added Mounjaro 2.5 mg qw, plan to increase dose as tolerated Advised to follow diabetic diet On statin and ARB F/u CMP and lipid panel Diabetic eye exam: Advised to follow up with Ophthalmology for diabetic eye exam  On Lyrica to 50 mg BID for DM neuropathy as he had constipation with higher dose and did not see much difference in pain control Tramadol PRN for pain

## 2022-11-08 NOTE — Assessment & Plan Note (Signed)
Serous/mucoid with intermittent blood tinged discharge from umbilical area Lotrisone cream prescribed - has noticed improvement now
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
## 2022-11-08 NOTE — Assessment & Plan Note (Addendum)
Had been smoking marijuana for anxiety/insomnia Sleep hygiene material provided On Trazodone to 150 mg qHS

## 2022-11-08 NOTE — Assessment & Plan Note (Signed)
Likely due to underlying DM and HTN Followed by Nephrology Avoid nephrotoxic agents On Losartan and Jardiance

## 2022-11-10 DIAGNOSIS — E119 Type 2 diabetes mellitus without complications: Secondary | ICD-10-CM | POA: Diagnosis not present

## 2022-11-10 LAB — HM DIABETES EYE EXAM

## 2022-11-13 ENCOUNTER — Other Ambulatory Visit: Payer: Self-pay | Admitting: Internal Medicine

## 2022-11-13 DIAGNOSIS — N522 Drug-induced erectile dysfunction: Secondary | ICD-10-CM

## 2022-11-16 ENCOUNTER — Telehealth: Payer: Self-pay | Admitting: Internal Medicine

## 2022-11-16 NOTE — Telephone Encounter (Signed)
Patient advised.

## 2022-11-16 NOTE — Telephone Encounter (Signed)
Patient called to ask Dr Posey Pronto.  Patient has never had the chicken pox before and shot either. Patient was told if not had the chicken pox he can catch the shingles.Or does patient need to get a updated chicken pox shot and the shingle shot? Please contact patient at [phone].

## 2022-11-16 NOTE — Telephone Encounter (Signed)
lmtrc

## 2022-11-17 ENCOUNTER — Telehealth: Payer: Self-pay | Admitting: Internal Medicine

## 2022-11-17 NOTE — Telephone Encounter (Signed)
FYI Patient had  Shingrix and tetanus shot done at pharm today.  Pharm will send over documentation

## 2022-11-19 ENCOUNTER — Other Ambulatory Visit: Payer: Self-pay | Admitting: Internal Medicine

## 2022-11-27 ENCOUNTER — Other Ambulatory Visit: Payer: Self-pay | Admitting: Internal Medicine

## 2022-11-29 DIAGNOSIS — E1122 Type 2 diabetes mellitus with diabetic chronic kidney disease: Secondary | ICD-10-CM | POA: Diagnosis not present

## 2022-11-29 DIAGNOSIS — E1129 Type 2 diabetes mellitus with other diabetic kidney complication: Secondary | ICD-10-CM | POA: Diagnosis not present

## 2022-11-29 DIAGNOSIS — I129 Hypertensive chronic kidney disease with stage 1 through stage 4 chronic kidney disease, or unspecified chronic kidney disease: Secondary | ICD-10-CM | POA: Diagnosis not present

## 2022-11-29 DIAGNOSIS — R82994 Hypercalciuria: Secondary | ICD-10-CM | POA: Diagnosis not present

## 2022-11-29 DIAGNOSIS — N189 Chronic kidney disease, unspecified: Secondary | ICD-10-CM | POA: Diagnosis not present

## 2022-11-29 DIAGNOSIS — N200 Calculus of kidney: Secondary | ICD-10-CM | POA: Diagnosis not present

## 2022-11-29 DIAGNOSIS — R82991 Hypocitraturia: Secondary | ICD-10-CM | POA: Diagnosis not present

## 2022-11-29 DIAGNOSIS — R809 Proteinuria, unspecified: Secondary | ICD-10-CM | POA: Diagnosis not present

## 2022-12-04 ENCOUNTER — Other Ambulatory Visit: Payer: Self-pay | Admitting: Internal Medicine

## 2022-12-04 ENCOUNTER — Telehealth: Payer: Self-pay | Admitting: Internal Medicine

## 2022-12-04 DIAGNOSIS — M5030 Other cervical disc degeneration, unspecified cervical region: Secondary | ICD-10-CM

## 2022-12-04 NOTE — Telephone Encounter (Signed)
Pt wants to know if he can please get a referral to neurosurgery for his neck pain

## 2022-12-05 ENCOUNTER — Other Ambulatory Visit: Payer: Self-pay | Admitting: Internal Medicine

## 2022-12-05 DIAGNOSIS — N189 Chronic kidney disease, unspecified: Secondary | ICD-10-CM | POA: Diagnosis not present

## 2022-12-05 DIAGNOSIS — E1129 Type 2 diabetes mellitus with other diabetic kidney complication: Secondary | ICD-10-CM | POA: Diagnosis not present

## 2022-12-05 DIAGNOSIS — E1122 Type 2 diabetes mellitus with diabetic chronic kidney disease: Secondary | ICD-10-CM | POA: Diagnosis not present

## 2022-12-05 DIAGNOSIS — N200 Calculus of kidney: Secondary | ICD-10-CM | POA: Diagnosis not present

## 2022-12-05 DIAGNOSIS — I129 Hypertensive chronic kidney disease with stage 1 through stage 4 chronic kidney disease, or unspecified chronic kidney disease: Secondary | ICD-10-CM | POA: Diagnosis not present

## 2022-12-05 DIAGNOSIS — R809 Proteinuria, unspecified: Secondary | ICD-10-CM | POA: Diagnosis not present

## 2022-12-05 DIAGNOSIS — E1140 Type 2 diabetes mellitus with diabetic neuropathy, unspecified: Secondary | ICD-10-CM

## 2022-12-05 DIAGNOSIS — D751 Secondary polycythemia: Secondary | ICD-10-CM | POA: Diagnosis not present

## 2022-12-12 ENCOUNTER — Telehealth: Payer: Self-pay | Admitting: Internal Medicine

## 2022-12-12 NOTE — Telephone Encounter (Signed)
Patient called to ask Dr Posey Pronto continue taking the vitamin D? Call back # [phone].

## 2022-12-12 NOTE — Telephone Encounter (Signed)
Patient advised.

## 2022-12-18 DIAGNOSIS — M542 Cervicalgia: Secondary | ICD-10-CM | POA: Diagnosis not present

## 2022-12-18 DIAGNOSIS — E1142 Type 2 diabetes mellitus with diabetic polyneuropathy: Secondary | ICD-10-CM | POA: Diagnosis not present

## 2022-12-18 DIAGNOSIS — Z6839 Body mass index (BMI) 39.0-39.9, adult: Secondary | ICD-10-CM | POA: Diagnosis not present

## 2022-12-18 DIAGNOSIS — G249 Dystonia, unspecified: Secondary | ICD-10-CM | POA: Diagnosis not present

## 2022-12-27 ENCOUNTER — Encounter: Payer: Self-pay | Admitting: Internal Medicine

## 2022-12-27 ENCOUNTER — Ambulatory Visit (INDEPENDENT_AMBULATORY_CARE_PROVIDER_SITE_OTHER): Payer: Medicare HMO | Admitting: Internal Medicine

## 2022-12-27 VITALS — BP 124/82 | HR 71 | Resp 16 | Ht 74.0 in | Wt 306.0 lb

## 2022-12-27 DIAGNOSIS — R000 Tachycardia, unspecified: Secondary | ICD-10-CM | POA: Diagnosis not present

## 2022-12-27 DIAGNOSIS — R079 Chest pain, unspecified: Secondary | ICD-10-CM | POA: Diagnosis not present

## 2022-12-27 NOTE — Patient Instructions (Addendum)
Thank you, Mr.Waddell P Logan for allowing [place] to provide your care today.   I have ordered the following labs for you:   Lab Orders         BMP8+EGFR         Magnesium      Referrals ordered today:    Referral Orders         Ambulatory referral to Cardiology       Reminders: I will follow up with results of the EKG and lab work ordered.     Tamsen Snider, M.D.

## 2022-12-27 NOTE — Progress Notes (Signed)
   HPI:[name] is a 52 y.o. male living with PTSD, MDD, HTN , HLD, obesity , and T2DM who presents for evaluation of a episode of fast heart rate and a sensation of fear/excitement/chest discomfort.  He is having a lot of stress in last 2 weeks and he has been getting a sensation in his chest at night when watching TV. The pain is substernal and last for minutes. He does not feel he is worrying when this feeling comes on.  He feels like a bubble is in his chest and a feeling of fear/excitement. He needs to take a few deep breaths and eventually it goes away. He has not noticed it to worsen with walking and doesn't usually occur during the day. His pulse was 190 at the neurosurgeons a couple months ago. No other episodes of a fast heart rate. He is having no active chest pain today.  He has history of PTSD , MDD, and GAD. He has been able to live with these conditions and not on medications for these at this time. He has history of asthma, but rarely needs to use inhaler. He is not a smoker. No family history of arrhythmias or MI . For the details of today's visit, please refer to the assessment and plan.  Physical Exam: Vitals:   12/27/22 1405  BP: 124/82  Pulse: 71  Resp: 16  SpO2: 92%  Weight: (!) 306 lb (138.8 kg)  Height: 6\' 2"  (1.88 m)     Physical Exam Constitutional:      General: He is not in acute distress.    Appearance: He is not ill-appearing or diaphoretic.  Cardiovascular:     Rate and Rhythm: Normal rate and regular rhythm.     Heart sounds: No murmur heard. Pulmonary:     Effort: No tachypnea or respiratory distress.     Breath sounds: Normal breath sounds.  Musculoskeletal:     Right lower leg: No tenderness. No edema.     Left lower leg: No tenderness. No edema.  Psychiatric:        Mood and Affect: Mood normal.        Behavior: Behavior normal.      Assessment & Plan:   Tachycardia Episode of tachycardia to 190 few months ago.  No EKG on file. -  EKG today  - Referral to cardiology for consideration of cardiac monitoring.  -Check BMP and Mg  Chest pain - Patient has multiple risk factors for coronary artery disease and chest pain is concerning for cardiac chest pain. EKG done in the office today and personally reviewed, NSR, RBBB, no ST elevation. I am not sure how old this RBBB is, but patient will need further workup.  - Discussed with patient, if he has more episodes of chest pain he will go to ED - Urgent referral placed to cardiology     Lorene Dy, MD

## 2022-12-27 NOTE — Progress Notes (Deleted)
   HPI:[name] is a 52 y.o. male who presents for evaluation of Chest Pain (Has a sensation of fear/excitement. No pain. States pulse was 190 at the neurosurgerons office a couple months ago.Started about a week ago. Happens a lot at night and rarely during the day. Has had a lot of family stress the past few weeks. Doesn't know if its panic or what's causing it) . For the details of today's visit, please refer to the assessment and plan.  Physical Exam: Vitals:   12/27/22 1405  BP: 124/82  Pulse: 71  Resp: 16  SpO2: 92%  Weight: (!) 306 lb (138.8 kg)  Height: 6\' 2"  (1.88 m)     Physical Exam   Assessment & Plan:   No problem-specific Assessment & Plan notes found for this encounter.    Lorene Dy, MD

## 2022-12-27 NOTE — Assessment & Plan Note (Addendum)
Episode of tachycardia to 190 few months ago.  No EKG on file. - EKG today  - Referral to cardiology for consideration of cardiac monitoring.  -Check BMP and Mg

## 2022-12-27 NOTE — Assessment & Plan Note (Addendum)
-   Patient has multiple risk factors for coronary artery disease and chest pain is concerning for cardiac chest pain. EKG done in the office today and personally reviewed, NSR, RBBB, no ST elevation. I am not sure how old this RBBB is, but patient will need further workup.  - Discussed with patient, if he has more episodes of chest pain he will go to ED - Urgent referral placed to cardiology

## 2022-12-28 ENCOUNTER — Telehealth: Payer: Self-pay | Admitting: Internal Medicine

## 2022-12-28 LAB — BMP8+EGFR
BUN/Creatinine Ratio: 12 (ref 9–20)
BUN: 20 mg/dL (ref 6–24)
CO2: 22 mmol/L (ref 20–29)
Calcium: 9.4 mg/dL (ref 8.7–10.2)
Chloride: 97 mmol/L (ref 96–106)
Creatinine, Ser: 1.69 mg/dL — ABNORMAL HIGH (ref 0.76–1.27)
Glucose: 181 mg/dL — ABNORMAL HIGH (ref 70–99)
Potassium: 4 mmol/L (ref 3.5–5.2)
Sodium: 138 mmol/L (ref 134–144)
eGFR: 49 mL/min/{1.73_m2} — ABNORMAL LOW (ref >59)

## 2022-12-28 LAB — MAGNESIUM: Magnesium: 2.1 mg/dL (ref 1.6–2.3)

## 2022-12-28 NOTE — Telephone Encounter (Signed)
Told front desk to advise him Court Joy left him a voicemail with results

## 2022-12-28 NOTE — Telephone Encounter (Signed)
Patient returning Dr Court Joy call. Lab results
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
## 2022-12-29 ENCOUNTER — Inpatient Hospital Stay: Payer: Medicare HMO

## 2022-12-29 ENCOUNTER — Inpatient Hospital Stay: Payer: Medicare HMO | Attending: Hematology | Admitting: Hematology

## 2022-12-29 ENCOUNTER — Encounter: Payer: Self-pay | Admitting: Hematology

## 2022-12-29 VITALS — BP 138/87 | HR 94 | Temp 97.6°F | Resp 16 | Ht 74.0 in | Wt 308.8 lb

## 2022-12-29 DIAGNOSIS — Z79899 Other long term (current) drug therapy: Secondary | ICD-10-CM | POA: Insufficient documentation

## 2022-12-29 DIAGNOSIS — E1140 Type 2 diabetes mellitus with diabetic neuropathy, unspecified: Secondary | ICD-10-CM | POA: Diagnosis not present

## 2022-12-29 DIAGNOSIS — D751 Secondary polycythemia: Secondary | ICD-10-CM | POA: Insufficient documentation

## 2022-12-29 DIAGNOSIS — F1290 Cannabis use, unspecified, uncomplicated: Secondary | ICD-10-CM | POA: Diagnosis not present

## 2022-12-29 DIAGNOSIS — Z809 Family history of malignant neoplasm, unspecified: Secondary | ICD-10-CM | POA: Diagnosis not present

## 2022-12-29 LAB — CBC WITH DIFFERENTIAL/PLATELET
Abs Immature Granulocytes: 0.03 10*3/uL (ref 0.00–0.07)
Basophils Absolute: 0 10*3/uL (ref 0.0–0.1)
Basophils Relative: 1 %
Eosinophils Absolute: 0.2 10*3/uL (ref 0.0–0.5)
Eosinophils Relative: 3 %
HCT: 46.4 % (ref 39.0–52.0)
Hemoglobin: 16.3 g/dL (ref 13.0–17.0)
Immature Granulocytes: 1 %
Lymphocytes Relative: 28 %
Lymphs Abs: 1.6 10*3/uL (ref 0.7–4.0)
MCH: 28.2 pg (ref 26.0–34.0)
MCHC: 35.1 g/dL (ref 30.0–36.0)
MCV: 80.3 fL (ref 80.0–100.0)
Monocytes Absolute: 0.4 10*3/uL (ref 0.1–1.0)
Monocytes Relative: 7 %
Neutro Abs: 3.4 10*3/uL (ref 1.7–7.7)
Neutrophils Relative %: 60 %
Platelets: 232 10*3/uL (ref 150–400)
RBC: 5.78 MIL/uL (ref 4.22–5.81)
RDW: 12.7 % (ref 11.5–15.5)
WBC: 5.6 10*3/uL (ref 4.0–10.5)
nRBC: 0 % (ref 0.0–0.2)

## 2022-12-29 NOTE — Patient Instructions (Signed)
Francisco Huffman  Discharge Instructions  You were seen and examined today by Dr. Delton Coombes. Dr. Delton Coombes is a hematologist, meaning that he specializes in blood abnormalities. Dr. Delton Coombes discussed your past medical history, family history of cancers/blood conditions and the events that led to you being here today.  You were referred to Dr. Delton Coombes due to elevated red blood cells.  Dr. Delton Coombes has recommended additional labs today for further evaluation.  Follow-up as scheduled.  Thank you for choosing [facility] to provide your oncology and hematology care.   To afford each patient quality time with our provider, please arrive at least 15 minutes before your scheduled appointment time. You may need to reschedule your appointment if you arrive late (10 or more minutes). Arriving late affects you and other patients whose appointments are after yours.  Also, if you miss three or more appointments without notifying the office, you may be dismissed from the clinic at the provider's discretion.    Again, thank you for choosing [facility].  Our hope is that these requests will decrease the amount of time that you wait before being seen by our physicians.   If you have a lab appointment with the [facility] please come in thru the Main Entrance and check in at the main information desk.           _____________________________________________________________  Should you have questions after your visit to [facility], please contact our office at [phone] and follow the prompts.  Our office hours are 8:00 a.m. to 4:30 p.m. Monday - Thursday and 8:00 a.m. to 2:30 p.m. Friday.  Please note that voicemails left after 4:00 p.m. may not be returned until the following business day.  We are closed weekends and all major holidays.  You do have access to a nurse 24-7, just call the main number to the clinic  [phone] and do not press any options, hold on the line and a nurse will answer the phone.    For prescription refill requests, have your pharmacy contact our office and allow 72 hours.    Masks are optional in the cancer centers. If you would like for your care team to wear a mask while they are taking care of you, please let them know. You may have one support person who is at least 52 years old accompany you for your appointments.

## 2022-12-30 LAB — ERYTHROPOIETIN: Erythropoietin: 6.9 m[IU]/mL (ref 2.6–18.5)

## 2023-01-02 ENCOUNTER — Other Ambulatory Visit: Payer: Self-pay | Admitting: Internal Medicine

## 2023-01-02 DIAGNOSIS — E1140 Type 2 diabetes mellitus with diabetic neuropathy, unspecified: Secondary | ICD-10-CM

## 2023-01-08 LAB — JAK2 V617F RFX CALR/MPL/E12-15

## 2023-01-08 LAB — CALR +MPL + E12-E15  (REFLEX)

## 2023-01-11 ENCOUNTER — Encounter: Payer: Self-pay | Admitting: Dermatology

## 2023-01-19 ENCOUNTER — Ambulatory Visit: Payer: Medicare HMO | Admitting: Internal Medicine

## 2023-01-28 ENCOUNTER — Other Ambulatory Visit: Payer: Self-pay | Admitting: Internal Medicine

## 2023-01-28 DIAGNOSIS — N522 Drug-induced erectile dysfunction: Secondary | ICD-10-CM

## 2023-01-29 ENCOUNTER — Ambulatory Visit: Payer: Medicare HMO | Admitting: Physician Assistant

## 2023-02-05 ENCOUNTER — Ambulatory Visit: Payer: Medicare HMO | Admitting: Physician Assistant

## 2023-02-12 ENCOUNTER — Encounter: Payer: Self-pay | Admitting: Internal Medicine

## 2023-02-12 ENCOUNTER — Ambulatory Visit (INDEPENDENT_AMBULATORY_CARE_PROVIDER_SITE_OTHER): Payer: Medicare HMO | Admitting: Internal Medicine

## 2023-02-12 VITALS — BP 113/72 | HR 67 | Ht 74.0 in | Wt 292.2 lb

## 2023-02-12 DIAGNOSIS — E782 Mixed hyperlipidemia: Secondary | ICD-10-CM

## 2023-02-12 DIAGNOSIS — N1831 Chronic kidney disease, stage 3a: Secondary | ICD-10-CM | POA: Diagnosis not present

## 2023-02-12 DIAGNOSIS — E1140 Type 2 diabetes mellitus with diabetic neuropathy, unspecified: Secondary | ICD-10-CM | POA: Diagnosis not present

## 2023-02-12 DIAGNOSIS — G4700 Insomnia, unspecified: Secondary | ICD-10-CM | POA: Diagnosis not present

## 2023-02-12 DIAGNOSIS — I10 Essential (primary) hypertension: Secondary | ICD-10-CM

## 2023-02-12 LAB — POCT GLYCOSYLATED HEMOGLOBIN (HGB A1C): HbA1c, POC (controlled diabetic range): 6.7 % (ref 0.0–7.0)

## 2023-02-12 MED ORDER — TIRZEPATIDE 5 MG/0.5ML ~~LOC~~ SOAJ: 5.0000 mg | SUBCUTANEOUS | 0 refills | Status: DC

## 2023-02-12 NOTE — Patient Instructions (Signed)
Please start taking Mounjaro 5 mg instead of 2.5 mg once you complete your current supply.  Please continue to take medications as prescribed.  Please continue to follow low carb diet and perform moderate exercise/walking at least 150 mins/week.

## 2023-02-12 NOTE — Assessment & Plan Note (Signed)
Lab Results  Component Value Date   HGBA1C 6.7 02/12/2023   Well-controlled On Jardiance 25 mg QD On Mounjaro 2.5 mg qw, plan to increase dose as tolerated - 5 mg qw now Advised to follow diabetic diet On statin and ARB F/u CMP and lipid panel Diabetic eye exam: Advised to follow up with Ophthalmology for diabetic eye exam  On Lyrica to 50 mg BID for DM neuropathy as he had constipation with higher dose and did not see much difference in pain control Tramadol PRN for pain

## 2023-02-12 NOTE — Assessment & Plan Note (Addendum)
Had been smoking marijuana for anxiety/insomnia Sleep hygiene material provided On Trazodone 150 mg qHS
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
## 2023-02-12 NOTE — Assessment & Plan Note (Addendum)
BP Readings from Last 1 Encounters:  02/12/23 113/72   Well controlled with losartan 50 mg QD and chlorthalidone 12.5 mg QD Counseled for compliance with the medications Advised DASH diet and moderate exercise/walking, at least 150 mins/week

## 2023-02-12 NOTE — Assessment & Plan Note (Signed)
On Crestor Checked lipid profile 
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
## 2023-02-12 NOTE — Assessment & Plan Note (Signed)
Likely due to underlying DM and HTN Followed by Nephrology Avoid nephrotoxic agents On Losartan and Jardiance 

## 2023-02-13 ENCOUNTER — Telehealth: Payer: Self-pay | Admitting: Internal Medicine

## 2023-02-13 ENCOUNTER — Other Ambulatory Visit: Payer: Self-pay

## 2023-02-13 ENCOUNTER — Inpatient Hospital Stay: Payer: Medicare HMO | Attending: Hematology | Admitting: Physician Assistant

## 2023-02-13 VITALS — BP 118/77 | HR 70 | Temp 98.1°F | Resp 18 | Ht 74.0 in | Wt 291.9 lb

## 2023-02-13 DIAGNOSIS — D751 Secondary polycythemia: Secondary | ICD-10-CM | POA: Insufficient documentation

## 2023-02-13 DIAGNOSIS — R5383 Other fatigue: Secondary | ICD-10-CM | POA: Insufficient documentation

## 2023-02-13 DIAGNOSIS — Z79899 Other long term (current) drug therapy: Secondary | ICD-10-CM | POA: Insufficient documentation

## 2023-02-13 DIAGNOSIS — E1140 Type 2 diabetes mellitus with diabetic neuropathy, unspecified: Secondary | ICD-10-CM | POA: Insufficient documentation

## 2023-02-13 NOTE — Telephone Encounter (Signed)
Pt returned call

## 2023-02-13 NOTE — Telephone Encounter (Signed)
Patient calling back has not heard back from nurse

## 2023-02-13 NOTE — Telephone Encounter (Signed)
Pt came by office wants a call back. Wants to know if he needs testosterone testing. To check testosterone levels

## 2023-02-13 NOTE — Patient Instructions (Signed)
[facility] [place] **   You were seen today by Tarri Abernethy PA-C for your elevated blood counts.   Your blood counts have returned to normal, but it is possible that you have intermittently elevated blood counts due to some of your medications (chlorthalidone). It is also possible that you may have some underlying sleep apnea, so I will refer you to sleep specialist at [facility]. We will check labs (including carbon oxide levels) and see you for an office visit in 4 months.  ** Thank you for trusting me with your healthcare!  I strive to provide all of my patients with quality care at each visit.  If you receive a survey for this visit, I would be so grateful to you for taking the time to provide feedback.  Thank you in advance!  ~ Iden Stripling                   Dr. Derek Jack   &   Tarri Abernethy, PA-C   - - - - - - - - - - - - - - - - - -    Thank you for choosing [facility] to provide your oncology and hematology care.  To afford each patient quality time with our provider, please arrive at least 15 minutes before your scheduled appointment time.   If you have a lab appointment with the [facility] please come in thru the Main Entrance and check in at the main information desk.  You need to re-schedule your appointment should you arrive 10 or more minutes late.  We strive to give you quality time with our providers, and arriving late affects you and other patients whose appointments are after yours.  Also, if you no show three or more times for appointments you may be dismissed from the clinic at the providers discretion.     Again, thank you for choosing [facility].  Our hope is that these requests will decrease the amount of time that you wait before being seen by our physicians.        _____________________________________________________________  Should you have questions after your visit to [facility], please contact our office at [phone] and follow the prompts.  Our office hours are 8:00 a.m. and 4:30 p.m. Monday - Friday.  Please note that voicemails left after 4:00 p.m. may not be returned until the following business day.  We are closed weekends and major holidays.  You do have access to a nurse 24-7, just call the main number to the clinic [phone] and do not press any options, hold on the line and a nurse will answer the phone.    For prescription refill requests, have your pharmacy contact our office and allow 72 hours.

## 2023-02-14 NOTE — Telephone Encounter (Signed)
Spoke to patient

## 2023-03-05 ENCOUNTER — Telehealth: Payer: Self-pay | Admitting: Internal Medicine

## 2023-03-05 NOTE — Telephone Encounter (Signed)
Patient asked for nurse Herbert Seta to return call

## 2023-03-05 NOTE — Telephone Encounter (Signed)
Patient advised.

## 2023-03-05 NOTE — Telephone Encounter (Signed)
Pt returning call

## 2023-03-05 NOTE — Telephone Encounter (Signed)
Spoke to patient

## 2023-03-05 NOTE — Telephone Encounter (Signed)
Wants call back . Wants to know if  PCP thinks he needs appt with cardiologist . Referral made by steen

## 2023-03-06 ENCOUNTER — Ambulatory Visit: Payer: Medicare HMO | Attending: Internal Medicine | Admitting: Cardiology

## 2023-03-06 ENCOUNTER — Encounter: Payer: Self-pay | Admitting: Cardiology

## 2023-03-06 VITALS — BP 110/80 | HR 74 | Ht 74.0 in | Wt 291.8 lb

## 2023-03-06 DIAGNOSIS — R000 Tachycardia, unspecified: Secondary | ICD-10-CM

## 2023-03-06 NOTE — Patient Instructions (Signed)
Medication Instructions:  Continue all current medications.  Labwork: none  Testing/Procedures: none  Follow-Up: As needed.    Any Other Special Instructions Will Be Listed Below (If Applicable).  If you need a refill on your cardiac medications before your next appointment, please call your pharmacy.  
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
## 2023-03-27 ENCOUNTER — Other Ambulatory Visit: Payer: Self-pay | Admitting: Internal Medicine

## 2023-03-27 DIAGNOSIS — E1140 Type 2 diabetes mellitus with diabetic neuropathy, unspecified: Secondary | ICD-10-CM

## 2023-04-02 DIAGNOSIS — N1831 Chronic kidney disease, stage 3a: Secondary | ICD-10-CM | POA: Diagnosis not present

## 2023-04-02 DIAGNOSIS — I129 Hypertensive chronic kidney disease with stage 1 through stage 4 chronic kidney disease, or unspecified chronic kidney disease: Secondary | ICD-10-CM | POA: Diagnosis not present

## 2023-04-02 DIAGNOSIS — R809 Proteinuria, unspecified: Secondary | ICD-10-CM | POA: Diagnosis not present

## 2023-04-02 DIAGNOSIS — D751 Secondary polycythemia: Secondary | ICD-10-CM | POA: Diagnosis not present

## 2023-04-02 DIAGNOSIS — Z79899 Other long term (current) drug therapy: Secondary | ICD-10-CM | POA: Diagnosis not present

## 2023-04-06 DIAGNOSIS — E669 Obesity, unspecified: Secondary | ICD-10-CM | POA: Diagnosis not present

## 2023-04-06 DIAGNOSIS — Z6837 Body mass index (BMI) 37.0-37.9, adult: Secondary | ICD-10-CM | POA: Diagnosis not present

## 2023-04-06 DIAGNOSIS — R030 Elevated blood-pressure reading, without diagnosis of hypertension: Secondary | ICD-10-CM | POA: Diagnosis not present

## 2023-04-06 DIAGNOSIS — K122 Cellulitis and abscess of mouth: Secondary | ICD-10-CM | POA: Diagnosis not present

## 2023-04-09 ENCOUNTER — Other Ambulatory Visit: Payer: Self-pay | Admitting: Internal Medicine

## 2023-04-09 DIAGNOSIS — G4700 Insomnia, unspecified: Secondary | ICD-10-CM

## 2023-04-12 ENCOUNTER — Other Ambulatory Visit: Payer: Self-pay | Admitting: Internal Medicine

## 2023-04-12 DIAGNOSIS — R809 Proteinuria, unspecified: Secondary | ICD-10-CM | POA: Diagnosis not present

## 2023-04-12 DIAGNOSIS — N522 Drug-induced erectile dysfunction: Secondary | ICD-10-CM

## 2023-04-12 DIAGNOSIS — N2581 Secondary hyperparathyroidism of renal origin: Secondary | ICD-10-CM | POA: Diagnosis not present

## 2023-04-12 DIAGNOSIS — N200 Calculus of kidney: Secondary | ICD-10-CM | POA: Diagnosis not present

## 2023-04-12 DIAGNOSIS — E1129 Type 2 diabetes mellitus with other diabetic kidney complication: Secondary | ICD-10-CM | POA: Diagnosis not present

## 2023-04-12 DIAGNOSIS — E1122 Type 2 diabetes mellitus with diabetic chronic kidney disease: Secondary | ICD-10-CM | POA: Diagnosis not present

## 2023-04-20 ENCOUNTER — Other Ambulatory Visit: Payer: Self-pay | Admitting: Internal Medicine

## 2023-04-20 DIAGNOSIS — E1140 Type 2 diabetes mellitus with diabetic neuropathy, unspecified: Secondary | ICD-10-CM

## 2023-05-07 ENCOUNTER — Ambulatory Visit (INDEPENDENT_AMBULATORY_CARE_PROVIDER_SITE_OTHER): Payer: Medicare HMO | Admitting: Internal Medicine

## 2023-05-07 ENCOUNTER — Encounter: Payer: Self-pay | Admitting: Internal Medicine

## 2023-05-07 ENCOUNTER — Telehealth: Payer: Self-pay | Admitting: Internal Medicine

## 2023-05-07 VITALS — BP 108/72 | HR 71 | Ht 74.0 in | Wt 277.0 lb

## 2023-05-07 DIAGNOSIS — R5382 Chronic fatigue, unspecified: Secondary | ICD-10-CM | POA: Diagnosis not present

## 2023-05-07 DIAGNOSIS — E782 Mixed hyperlipidemia: Secondary | ICD-10-CM | POA: Diagnosis not present

## 2023-05-07 DIAGNOSIS — I10 Essential (primary) hypertension: Secondary | ICD-10-CM

## 2023-05-07 DIAGNOSIS — N1831 Chronic kidney disease, stage 3a: Secondary | ICD-10-CM

## 2023-05-07 DIAGNOSIS — E1140 Type 2 diabetes mellitus with diabetic neuropathy, unspecified: Secondary | ICD-10-CM

## 2023-05-07 DIAGNOSIS — D751 Secondary polycythemia: Secondary | ICD-10-CM

## 2023-05-07 DIAGNOSIS — G4733 Obstructive sleep apnea (adult) (pediatric): Secondary | ICD-10-CM

## 2023-05-07 MED ORDER — TIRZEPATIDE 7.5 MG/0.5ML ~~LOC~~ SOAJ
7.5000 mg | SUBCUTANEOUS | 1 refills | Status: DC
Start: 2023-05-07 — End: 2023-05-07

## 2023-05-07 MED ORDER — TIRZEPATIDE 10 MG/0.5ML ~~LOC~~ SOAJ
10.0000 mg | SUBCUTANEOUS | 1 refills | Status: DC
Start: 2023-05-07 — End: 2024-01-16

## 2023-05-07 MED ORDER — CYANOCOBALAMIN 1000 MCG/ML IJ SOLN
1000.0000 ug | Freq: Once | INTRAMUSCULAR | Status: AC
Start: 2023-05-07 — End: 2023-05-07
  Administered 2023-05-07: 1000 ug via INTRAMUSCULAR

## 2023-05-07 NOTE — Assessment & Plan Note (Signed)
BP Readings from Last 1 Encounters:  05/07/23 108/72   Well controlled with losartan 50 mg QD and chlorthalidone 12.5 mg QD Counseled for compliance with the medications Advised DASH diet and moderate exercise/walking, at least 150 mins/week
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
## 2023-05-07 NOTE — Assessment & Plan Note (Addendum)
Lab Results  Component Value Date   HGBA1C 6.7 02/12/2023   Well-controlled On Jardiance 25 mg QD On Mounjaro 5 mg qw, plan to increase dose as tolerated - 10 mg qw now Advised to follow diabetic diet On statin and ARB F/u CMP and lipid panel Diabetic eye exam: Advised to follow up with Ophthalmology for diabetic eye exam  On Lyrica to 50 mg BID for DM neuropathy as he had constipation with higher dose and did not see much difference in pain control Tramadol PRN for pain

## 2023-05-07 NOTE — Telephone Encounter (Signed)
Patient called to follow up refill script sent in today for tirzepatide [facility]) 7.5 MG/0.5ML Pen   Pharmacy has med on back order.   Provider told patient he will send in a script for 10 ML if needed. Pharmacy needs a new script.  Pharmacy confirmed as  [id number] [place], [place] - [address] AT [address] [facility] [address] [place], [place] [address] Phone: [phone]  Fax: [phone] DEA #: [id number]   Please advise patient when new script sent at [phone].

## 2023-05-07 NOTE — Patient Instructions (Addendum)
Please start taking Mounjaro 7.5 mg instead of 5 mg.  Please continue to take medications as prescribed.  Please continue to follow low carb diet and perform moderate exercise/walking at least 150 mins/week.  Please get fasting blood tests done before the next visit.

## 2023-05-11 DIAGNOSIS — R5382 Chronic fatigue, unspecified: Secondary | ICD-10-CM | POA: Insufficient documentation

## 2023-05-11 DIAGNOSIS — G4733 Obstructive sleep apnea (adult) (pediatric): Secondary | ICD-10-CM | POA: Insufficient documentation

## 2023-05-11 NOTE — Assessment & Plan Note (Signed)
Likely due to underlying DM and HTN Followed by Nephrology Avoid nephrotoxic agents On Losartan and Jardiance 

## 2023-05-11 NOTE — Assessment & Plan Note (Signed)
Has snoring, daytime fatigue and apneic episodes STOP-BANG: 8 At high risk for moderate to severe OSA Ordered home sleep study

## 2023-05-11 NOTE — Assessment & Plan Note (Signed)
On Crestor Checked lipid profile 

## 2023-05-11 NOTE — Assessment & Plan Note (Signed)
Has been evaluated by hematology Could be due to OSA Needs home sleep study

## 2023-05-11 NOTE — Assessment & Plan Note (Signed)
Could could be due to OSA and other chronic medical conditions Needs to maintain adequate hydration and eat at regular intervals Needs home sleep study Check total and free testosterone as per patient request - needs to do it in AM

## 2023-05-14 ENCOUNTER — Telehealth: Payer: Self-pay

## 2023-05-14 ENCOUNTER — Other Ambulatory Visit: Payer: Self-pay | Admitting: Internal Medicine

## 2023-05-14 DIAGNOSIS — E1140 Type 2 diabetes mellitus with diabetic neuropathy, unspecified: Secondary | ICD-10-CM | POA: Diagnosis not present

## 2023-05-14 DIAGNOSIS — E782 Mixed hyperlipidemia: Secondary | ICD-10-CM | POA: Diagnosis not present

## 2023-05-14 DIAGNOSIS — R5382 Chronic fatigue, unspecified: Secondary | ICD-10-CM | POA: Diagnosis not present

## 2023-05-14 DIAGNOSIS — K649 Unspecified hemorrhoids: Secondary | ICD-10-CM

## 2023-05-14 MED ORDER — HYDROCORTISONE ACETATE 25 MG RE SUPP
25.0000 mg | Freq: Two times a day (BID) | RECTAL | 0 refills | Status: AC
Start: 2023-05-14 — End: ?

## 2023-05-14 NOTE — Telephone Encounter (Signed)
No answer by patient.

## 2023-05-14 NOTE — Telephone Encounter (Signed)
Patient returning nurse call about medication that was discuss this morning, Shean call back # [phone]

## 2023-05-15 ENCOUNTER — Other Ambulatory Visit: Payer: Self-pay | Admitting: Internal Medicine

## 2023-05-15 DIAGNOSIS — K611 Rectal abscess: Secondary | ICD-10-CM | POA: Insufficient documentation

## 2023-05-15 LAB — CMP14+EGFR
Alkaline Phosphatase: 73 IU/L (ref 44–121)
BUN: 25 mg/dL — ABNORMAL HIGH (ref 6–24)
CO2: 22 mmol/L (ref 20–29)
Globulin, Total: 2.6 g/dL (ref 1.5–4.5)
Glucose: 115 mg/dL — ABNORMAL HIGH (ref 70–99)
Sodium: 139 mmol/L (ref 134–144)
eGFR: 42 mL/min/{1.73_m2} — ABNORMAL LOW (ref >59)

## 2023-05-15 LAB — LIPID PANEL
Chol/HDL Ratio: 5.3 ratio — ABNORMAL HIGH (ref 0.0–5.0)
Cholesterol, Total: 144 mg/dL (ref 100–199)
HDL: 27 mg/dL — ABNORMAL LOW (ref >39)
LDL Chol Calc (NIH): 90 mg/dL (ref 0–99)
Triglycerides: 155 mg/dL — ABNORMAL HIGH (ref 0–149)
VLDL Cholesterol Cal: 27 mg/dL (ref 5–40)

## 2023-05-15 LAB — MICROALBUMIN / CREATININE URINE RATIO

## 2023-05-15 MED ORDER — AMOXICILLIN-POT CLAVULANATE 875-125 MG PO TABS
1.0000 | ORAL_TABLET | Freq: Two times a day (BID) | ORAL | 0 refills | Status: DC
Start: 2023-05-15 — End: 2023-06-06

## 2023-05-15 NOTE — Telephone Encounter (Signed)
Spoke to patient

## 2023-05-16 ENCOUNTER — Ambulatory Visit: Payer: Medicare HMO | Admitting: Internal Medicine

## 2023-05-16 LAB — CMP14+EGFR
AST: 18 IU/L (ref 0–40)
Albumin: 4.3 g/dL (ref 3.8–4.9)
Calcium: 9.6 mg/dL (ref 8.7–10.2)
Creatinine, Ser: 1.89 mg/dL — ABNORMAL HIGH (ref 0.76–1.27)
Potassium: 3.6 mmol/L (ref 3.5–5.2)

## 2023-05-16 LAB — MICROALBUMIN / CREATININE URINE RATIO
Creatinine, Urine: 138.8 mg/dL
Microalbumin, Urine: 6.4 ug/mL

## 2023-05-16 LAB — TESTOSTERONE,FREE AND TOTAL

## 2023-05-17 LAB — CMP14+EGFR
ALT: 21 IU/L (ref 0–44)
BUN/Creatinine Ratio: 13 (ref 9–20)
Bilirubin Total: 0.5 mg/dL (ref 0.0–1.2)
Chloride: 99 mmol/L (ref 96–106)
Total Protein: 6.9 g/dL (ref 6.0–8.5)

## 2023-05-17 LAB — HEMOGLOBIN A1C
Est. average glucose Bld gHb Est-mCnc: 140 mg/dL
Hgb A1c MFr Bld: 6.5 % — ABNORMAL HIGH (ref 4.8–5.6)

## 2023-05-17 LAB — TESTOSTERONE,FREE AND TOTAL: Testosterone: 183 ng/dL — ABNORMAL LOW (ref 264–916)

## 2023-05-21 ENCOUNTER — Telehealth: Payer: Self-pay | Admitting: Internal Medicine

## 2023-05-21 ENCOUNTER — Other Ambulatory Visit: Payer: Self-pay

## 2023-05-21 MED ORDER — CHLORTHALIDONE 25 MG PO TABS: 12.5000 mg | ORAL_TABLET | Freq: Every day | ORAL | 0 refills | Status: DC

## 2023-05-21 NOTE — Telephone Encounter (Signed)
RT CALL LABS

## 2023-05-21 NOTE — Telephone Encounter (Signed)
Spoke with patient. Refilled Chlorthalidone.

## 2023-05-21 NOTE — Telephone Encounter (Signed)
Call sent to VM.

## 2023-05-25 ENCOUNTER — Other Ambulatory Visit: Payer: Self-pay | Admitting: Internal Medicine

## 2023-05-25 DIAGNOSIS — E1140 Type 2 diabetes mellitus with diabetic neuropathy, unspecified: Secondary | ICD-10-CM

## 2023-05-30 ENCOUNTER — Telehealth: Payer: Self-pay | Admitting: Internal Medicine

## 2023-05-30 NOTE — Telephone Encounter (Signed)
Patient calling having questions regarding his medication. Please advise [phone]  Thank you

## 2023-05-30 NOTE — Telephone Encounter (Signed)
 Spoke to patient

## 2023-06-04 ENCOUNTER — Ambulatory Visit: Payer: Medicare HMO | Admitting: Internal Medicine

## 2023-06-06 ENCOUNTER — Ambulatory Visit (INDEPENDENT_AMBULATORY_CARE_PROVIDER_SITE_OTHER): Payer: Medicare HMO | Admitting: Internal Medicine

## 2023-06-06 ENCOUNTER — Encounter: Payer: Self-pay | Admitting: Internal Medicine

## 2023-06-06 VITALS — BP 108/73 | HR 83 | Ht 74.0 in | Wt 271.0 lb

## 2023-06-06 DIAGNOSIS — I10 Essential (primary) hypertension: Secondary | ICD-10-CM | POA: Diagnosis not present

## 2023-06-06 DIAGNOSIS — K5904 Chronic idiopathic constipation: Secondary | ICD-10-CM

## 2023-06-06 DIAGNOSIS — E538 Deficiency of other specified B group vitamins: Secondary | ICD-10-CM | POA: Diagnosis not present

## 2023-06-06 DIAGNOSIS — R5382 Chronic fatigue, unspecified: Secondary | ICD-10-CM

## 2023-06-06 DIAGNOSIS — K611 Rectal abscess: Secondary | ICD-10-CM

## 2023-06-06 DIAGNOSIS — K649 Unspecified hemorrhoids: Secondary | ICD-10-CM | POA: Diagnosis not present

## 2023-06-06 MED ORDER — CYANOCOBALAMIN 1000 MCG/ML IJ SOLN
1000.0000 ug | Freq: Once | INTRAMUSCULAR | Status: AC
Start: 2023-06-06 — End: 2023-06-06
  Administered 2023-06-06: 1000 ug via INTRAMUSCULAR

## 2023-06-06 MED ORDER — LINACLOTIDE 145 MCG PO CAPS
145.0000 ug | ORAL_CAPSULE | Freq: Every day | ORAL | 2 refills | Status: DC
Start: 2023-06-06 — End: 2023-12-13

## 2023-06-06 MED ORDER — SILVER SULFADIAZINE 1 % EX CREA
1.0000 | TOPICAL_CREAM | Freq: Every day | CUTANEOUS | 0 refills | Status: AC
Start: 2023-06-06 — End: ?

## 2023-06-06 NOTE — Assessment & Plan Note (Signed)
Recent episode of pus discharge and rectal swelling likely rectal abscess Has completed Augmentin now Still has rectal pain, advised to perform sitz bath Silvadene cream for local antiseptic Referred to general surgery

## 2023-06-06 NOTE — Assessment & Plan Note (Signed)
Rectal soreness, worse with chronic constipation ?Could be due to internal hemorrhoids ?Anusol suppository as needed ?Referred to GI ?

## 2023-06-06 NOTE — Assessment & Plan Note (Signed)
BP Readings from Last 1 Encounters:  06/06/23 108/73   Well controlled with losartan 50 mg QD and chlorthalidone 12.5 mg QD Since he has dizziness, likely due to orthostatic hypotension, DC chlorthalidone Counseled for compliance with the medications Advised DASH diet and moderate exercise/walking, at least 150 mins/week

## 2023-06-06 NOTE — Assessment & Plan Note (Signed)
Has tried Colace, senna, MiraLAX Have discontinued gabapentin and Elavil in the past that was worsening constipation Started Linzess Maintain adequate hydration Prune juice as needed

## 2023-06-06 NOTE — Assessment & Plan Note (Signed)
Could could be due to OSA and other chronic medical conditions Needs to maintain adequate hydration and eat at regular intervals DC chlorthalidone as it could lead to dehydration Needs home sleep study Recheck total and free testosterone as per patient request - needs to do it in AM
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
## 2023-06-06 NOTE — Patient Instructions (Addendum)
Please stop taking Chlorthalidone.  Please start taking Linzess for constipation.  Please perform sitz bath once daily. Please apply Silvadene cream for rectal sore after sitz bath.  You are being referred to General surgeon for rectal abscess.

## 2023-06-11 ENCOUNTER — Telehealth: Payer: Self-pay | Admitting: Internal Medicine

## 2023-06-11 NOTE — Telephone Encounter (Signed)
Pt called in regard to sleep study machine. Patient has sent machine back to company. Could not sleep with machine on ,. He is anabel to wear ring necklaces and  bracelets and the machine felt like all of the above.     Pt also called in regard to surgeon referral.  Spot on bottom was infected x 4 weeks ag. Patient feels like it is heading that direction again. Appointment with surgeon is not until August 22nd ad patient feel like he cannot wait that lon g. Wants to see if he can get referral with sooner availability.  Wants a call back in regard.

## 2023-06-12 ENCOUNTER — Telehealth: Payer: Self-pay | Admitting: Internal Medicine

## 2023-06-12 NOTE — Addendum Note (Signed)
Addended byTrena Platt on: 06/12/2023 05:12 PM   Modules accepted: Orders

## 2023-06-12 NOTE — Telephone Encounter (Signed)
Patient called concern sore on bottom getting more sore and patient feel like it will be get infected before 08.27.2024 was wander does he need refill on cream medication or what does patient need to do.  Patient is asking for call back [phone].

## 2023-06-13 NOTE — Telephone Encounter (Signed)
Patient has appt with [place] 06/14/2023

## 2023-06-18 DIAGNOSIS — K610 Anal abscess: Secondary | ICD-10-CM | POA: Diagnosis not present

## 2023-06-22 ENCOUNTER — Inpatient Hospital Stay: Payer: Medicare HMO | Attending: Hematology

## 2023-06-22 DIAGNOSIS — D751 Secondary polycythemia: Secondary | ICD-10-CM | POA: Diagnosis not present

## 2023-06-22 LAB — CBC WITH DIFFERENTIAL/PLATELET
Abs Immature Granulocytes: 0.03 10*3/uL (ref 0.00–0.07)
Basophils Absolute: 0.1 10*3/uL (ref 0.0–0.1)
Basophils Relative: 1 %
Eosinophils Absolute: 0.2 10*3/uL (ref 0.0–0.5)
Eosinophils Relative: 2 %
HCT: 50 % (ref 39.0–52.0)
Hemoglobin: 16.8 g/dL (ref 13.0–17.0)
Immature Granulocytes: 0 %
Lymphocytes Relative: 25 %
Lymphs Abs: 1.9 10*3/uL (ref 0.7–4.0)
MCH: 28 pg (ref 26.0–34.0)
MCHC: 33.6 g/dL (ref 30.0–36.0)
MCV: 83.2 fL (ref 80.0–100.0)
Monocytes Absolute: 0.5 10*3/uL (ref 0.1–1.0)
Monocytes Relative: 6 %
Neutro Abs: 5.2 10*3/uL (ref 1.7–7.7)
Neutrophils Relative %: 66 %
Platelets: 266 10*3/uL (ref 150–400)
RBC: 6.01 MIL/uL — ABNORMAL HIGH (ref 4.22–5.81)
RDW: 13.3 % (ref 11.5–15.5)
WBC: 7.9 10*3/uL (ref 4.0–10.5)
nRBC: 0 % (ref 0.0–0.2)

## 2023-06-22 LAB — CARBOXYHEMOGLOBIN - COOX: Carboxyhemoglobin: 1.3 % (ref 0.5–1.5)

## 2023-06-27 ENCOUNTER — Other Ambulatory Visit: Payer: Self-pay | Admitting: Internal Medicine

## 2023-06-27 ENCOUNTER — Telehealth: Payer: Self-pay | Admitting: Internal Medicine

## 2023-06-27 NOTE — Telephone Encounter (Signed)
Patient called asked if nurse will return [facility] pharmacy a call , they have been trying to get in touch with Dr patel about this medicine potassium citrate (UROCIT-K) 5 MEQ (540 MG) SR tablet [[id number]]  Pharmacy: [facility]

## 2023-06-27 NOTE — Telephone Encounter (Signed)
Provided pharmacy with Dr Wolfgang [place] fax number , to request refills

## 2023-06-29 ENCOUNTER — Inpatient Hospital Stay: Payer: Medicare HMO | Admitting: Oncology

## 2023-06-29 DIAGNOSIS — D751 Secondary polycythemia: Secondary | ICD-10-CM | POA: Diagnosis not present
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
## 2023-07-10 ENCOUNTER — Ambulatory Visit: Payer: Medicare HMO | Admitting: General Surgery

## 2023-07-12 ENCOUNTER — Other Ambulatory Visit: Payer: Self-pay | Admitting: Internal Medicine

## 2023-07-12 ENCOUNTER — Telehealth: Payer: Self-pay | Admitting: Internal Medicine

## 2023-07-12 DIAGNOSIS — N522 Drug-induced erectile dysfunction: Secondary | ICD-10-CM

## 2023-07-12 NOTE — Telephone Encounter (Signed)
Prescription Request  07/12/2023  LOV: 06/06/2023  What is the name of the medication or equipment? sildenafil (VIAGRA) 50 MG tablet [[id number]]    Have you contacted your pharmacy to request a refill? Yes   Which pharmacy would you like this sent to?   [facility] [address], [place] - [address] [place] [address] Phone: [phone] Fax: [phone]    Patient notified that their request is being sent to the clinical staff for review and that they should receive a response within 2 business days.   Please advise at Mobile [phone] (mobile)     Pt states that he will need to have surgery.

## 2023-07-13 ENCOUNTER — Other Ambulatory Visit: Payer: Self-pay | Admitting: Internal Medicine

## 2023-07-13 DIAGNOSIS — E782 Mixed hyperlipidemia: Secondary | ICD-10-CM

## 2023-07-13 NOTE — Telephone Encounter (Signed)
Refill sent.

## 2023-07-17 ENCOUNTER — Telehealth: Payer: Self-pay | Admitting: Internal Medicine

## 2023-07-17 NOTE — Telephone Encounter (Signed)
Patient calling saying the provider that in [facility], the provider patient been seeing is out on materity and asking can he be referred to Dr Lovell Sheehan in [place] to see him   Patient call back # [phone] about this referral.

## 2023-07-27 DIAGNOSIS — K610 Anal abscess: Secondary | ICD-10-CM | POA: Diagnosis not present

## 2023-08-07 ENCOUNTER — Ambulatory Visit: Payer: Medicare HMO | Admitting: Internal Medicine

## 2023-08-07 ENCOUNTER — Encounter: Payer: Self-pay | Admitting: Internal Medicine

## 2023-08-07 VITALS — BP 130/85 | HR 82 | Ht 74.0 in | Wt 266.8 lb

## 2023-08-07 DIAGNOSIS — Z7985 Long-term (current) use of injectable non-insulin antidiabetic drugs: Secondary | ICD-10-CM

## 2023-08-07 DIAGNOSIS — Z0001 Encounter for general adult medical examination with abnormal findings: Secondary | ICD-10-CM | POA: Diagnosis not present

## 2023-08-07 DIAGNOSIS — I10 Essential (primary) hypertension: Secondary | ICD-10-CM

## 2023-08-07 DIAGNOSIS — G4733 Obstructive sleep apnea (adult) (pediatric): Secondary | ICD-10-CM | POA: Diagnosis not present

## 2023-08-07 DIAGNOSIS — N183 Chronic kidney disease, stage 3 unspecified: Secondary | ICD-10-CM | POA: Diagnosis not present

## 2023-08-07 DIAGNOSIS — Z23 Encounter for immunization: Secondary | ICD-10-CM | POA: Diagnosis not present

## 2023-08-07 DIAGNOSIS — D631 Anemia in chronic kidney disease: Secondary | ICD-10-CM | POA: Diagnosis not present

## 2023-08-07 DIAGNOSIS — E1140 Type 2 diabetes mellitus with diabetic neuropathy, unspecified: Secondary | ICD-10-CM | POA: Diagnosis not present

## 2023-08-07 DIAGNOSIS — Z7984 Long term (current) use of oral hypoglycemic drugs: Secondary | ICD-10-CM

## 2023-08-07 DIAGNOSIS — E349 Endocrine disorder, unspecified: Secondary | ICD-10-CM | POA: Diagnosis not present

## 2023-08-07 DIAGNOSIS — R5382 Chronic fatigue, unspecified: Secondary | ICD-10-CM

## 2023-08-07 NOTE — Assessment & Plan Note (Addendum)
Has snoring, daytime fatigue and apneic episodes STOP-BANG: 8 At high risk for moderate to severe OSA Ordered home sleep study, but could not perform it - prefers to avoid sleep test in lab as well

## 2023-08-07 NOTE — Assessment & Plan Note (Addendum)
BP Readings from Last 1 Encounters:  08/07/23 130/85   Well controlled with losartan 50 mg QD Counseled for compliance with the medications Advised DASH diet and moderate exercise/walking, at least 150 mins/week
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
## 2023-08-07 NOTE — Patient Instructions (Addendum)
Please continue to take medications as prescribed.  Please continue to follow low carb diet and perform moderate exercise/walking at least 150 mins/week.  Please get blood tests done in the morning.

## 2023-08-07 NOTE — Assessment & Plan Note (Signed)
Lab Results  Component Value Date   HGBA1C 6.5 (H) 05/14/2023   Well-controlled On Jardiance 25 mg QD On Mounjaro 7.5 mg qw, plan to increase dose as tolerated - 10 mg qw now Advised to follow diabetic diet On statin and ARB F/u CMP and lipid panel Diabetic eye exam: Advised to follow up with Ophthalmology for diabetic eye exam  On Lyrica to 50 mg BID for DM neuropathy as he had constipation with higher dose and did not see much difference in pain control Tramadol PRN for pain

## 2023-08-10 NOTE — Assessment & Plan Note (Signed)
Needs repeat testosterone level Would be cautious about testosterone therapy due to erythrocytosis Will need periodic phlebotomy+

## 2023-08-10 NOTE — Assessment & Plan Note (Signed)
Could could be due to OSA and other chronic medical conditions Needs to maintain adequate hydration and eat at regular intervals DCed chlorthalidone as it could lead to dehydration Needs home sleep study Recheck total and free testosterone as per patient request - needs to do it in AM

## 2023-08-10 NOTE — Assessment & Plan Note (Signed)

## 2023-08-14 ENCOUNTER — Ambulatory Visit: Payer: Medicare HMO | Admitting: Surgery

## 2023-08-14 DIAGNOSIS — N200 Calculus of kidney: Secondary | ICD-10-CM | POA: Diagnosis not present

## 2023-08-14 DIAGNOSIS — E1129 Type 2 diabetes mellitus with other diabetic kidney complication: Secondary | ICD-10-CM | POA: Diagnosis not present

## 2023-08-14 DIAGNOSIS — E1122 Type 2 diabetes mellitus with diabetic chronic kidney disease: Secondary | ICD-10-CM | POA: Diagnosis not present

## 2023-08-14 DIAGNOSIS — R809 Proteinuria, unspecified: Secondary | ICD-10-CM | POA: Diagnosis not present

## 2023-08-15 ENCOUNTER — Ambulatory Visit (INDEPENDENT_AMBULATORY_CARE_PROVIDER_SITE_OTHER): Payer: Medicare HMO

## 2023-08-15 ENCOUNTER — Other Ambulatory Visit: Payer: Self-pay | Admitting: Internal Medicine

## 2023-08-15 VITALS — Ht 74.0 in | Wt 265.0 lb

## 2023-08-15 DIAGNOSIS — Z0000 Encounter for general adult medical examination without abnormal findings: Secondary | ICD-10-CM

## 2023-08-15 DIAGNOSIS — K611 Rectal abscess: Secondary | ICD-10-CM

## 2023-08-15 MED ORDER — AMOXICILLIN-POT CLAVULANATE 875-125 MG PO TABS
1.0000 | ORAL_TABLET | Freq: Two times a day (BID) | ORAL | 0 refills | Status: DC
Start: 2023-08-15 — End: 2023-09-17
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
## 2023-08-15 NOTE — Patient Instructions (Signed)
Francisco Huffman , Thank you for taking time to come for your Medicare Wellness Visit. I appreciate your ongoing commitment to your health goals. Please review the following plan we discussed and let me know if I can assist you in the future.   Referrals/Orders/Follow-Ups/Clinician Recommendations:  Next Medicare Annual Wellness Visit: November 26, 2024 at 1:50pm virtual visit  This is a list of the screening recommended for you and due dates:  Health Maintenance  Topic Date Due   Complete foot exam   07/04/2023   Eye exam for diabetics  11/11/2023   Hemoglobin A1C  11/14/2023   Yearly kidney function blood test for diabetes  05/13/2024   Yearly kidney health urinalysis for diabetes  05/13/2024   Medicare Annual Wellness Visit  08/14/2024   Cologuard (Stool DNA test)  11/10/2024   DTaP/Tdap/Td vaccine (2 - Td or Tdap) 11/17/2032   Flu Shot  Completed   Hepatitis C Screening  Completed   HIV Screening  Completed   Zoster (Shingles) Vaccine  Completed   HPV Vaccine  Aged Out   COVID-19 Vaccine  Discontinued    Advanced directives: (ACP Link)Information on Advanced Care Planning can be found at [facility] of [facility] Advance Health Care Directives Advance Health Care Directives ([email or web address]  Advance Care Planning is important because it:  [x]  Makes sure you receive the medical care that is consistent with your values, goals, and preferences  [x]  It provides guidance to your family and loved ones and it also reduces their decisional burden about whether or not they are making the right decisions based on what you want done  Follow the link provided in your after visit summary or read over the paperwork we have mailed to you to help you started getting your Advance Directives in place. If you need assistance in completing these, please reach out to [place] so that we can help you!  Next Medicare Annual Wellness Visit scheduled for next year: Yes Preventive Care 27-67 Years Old,  Male Preventive care refers to lifestyle choices and visits with your health care provider that can promote health and wellness. Preventive care visits are also called wellness exams. What can I expect for my preventive care visit? Counseling During your preventive care visit, your health care provider may ask about your: Medical history, including: Past medical problems. Family medical history. Current health, including: Emotional well-being. Home life and relationship well-being. Sexual activity. Lifestyle, including: Alcohol, nicotine or tobacco, and drug use. Access to firearms. Diet, exercise, and sleep habits. Safety issues such as seatbelt and bike helmet use. Sunscreen use. Work and work Astronomer. Physical exam Your health care provider will check your: Height and weight. These may be used to calculate your BMI (body mass index). BMI is a measurement that tells if you are at a healthy weight. Waist circumference. This measures the distance around your waistline. This measurement also tells if you are at a healthy weight and may help predict your risk of certain diseases, such as type 2 diabetes and high blood pressure. Heart rate and blood pressure. Body temperature. Skin for abnormal spots. What immunizations do I need?  Vaccines are usually given at various ages, according to a schedule. Your health care provider will recommend vaccines for you based on your age, medical history, and lifestyle or other factors, such as travel or where you work. What tests do I need? Screening Your health care provider may recommend screening tests for certain conditions. This may include: Lipid and cholesterol levels.  Diabetes screening. This is done by checking your blood sugar (glucose) after you have not eaten for a while (fasting). Hepatitis B test. Hepatitis C test. HIV (human immunodeficiency virus) test. STI (sexually transmitted infection) testing, if you are at risk. Lung  cancer screening. Prostate cancer screening. Colorectal cancer screening. Talk with your health care provider about your test results, treatment options, and if necessary, the need for more tests. Follow these instructions at home: Eating and drinking  Eat a diet that includes fresh fruits and vegetables, whole grains, lean protein, and low-fat dairy products. Take vitamin and mineral supplements as recommended by your health care provider. Do not drink alcohol if your health care provider tells you not to drink. If you drink alcohol: Limit how much you have to 0-2 drinks a day. Know how much alcohol is in your drink. In the [place] one drink equals one 12 oz bottle of beer (355 mL), one 5 oz glass of wine (148 mL), or one 1 oz glass of hard liquor (44 mL). Lifestyle Brush your teeth every morning and night with fluoride toothpaste. Floss one time each day. Exercise for at least 30 minutes 5 or more days each week. Do not use any products that contain nicotine or tobacco. These products include cigarettes, chewing tobacco, and vaping devices, such as e-cigarettes. If you need help quitting, ask your health care provider. Do not use drugs. If you are sexually active, practice safe sex. Use a condom or other form of protection to prevent STIs. Take aspirin only as told by your health care provider. Make sure that you understand how much to take and what form to take. Work with your health care provider to find out whether it is safe and beneficial for you to take aspirin daily. Find healthy ways to manage stress, such as: Meditation, yoga, or listening to music. Journaling. Talking to a trusted person. Spending time with friends and family. Minimize exposure to UV radiation to reduce your risk of skin cancer. Safety Always wear your seat belt while driving or riding in a vehicle. Do not drive: If you have been drinking alcohol. Do not ride with someone who has been drinking. When you are  tired or distracted. While texting. If you have been using any mind-altering substances or drugs. Wear a helmet and other protective equipment during sports activities. If you have firearms in your house, make sure you follow all gun safety procedures. What's next? Go to your health care provider once a year for an annual wellness visit. Ask your health care provider how often you should have your eyes and teeth checked. Stay up to date on all vaccines. This information is not intended to replace advice given to you by your health care provider. Make sure you discuss any questions you have with your health care provider. Document Revised: 04/27/2021 Document Reviewed: 04/27/2021 Elsevier Patient Education  2024 [employer].

## 2023-08-29 ENCOUNTER — Telehealth: Payer: Self-pay | Admitting: Internal Medicine

## 2023-08-29 NOTE — Telephone Encounter (Signed)
Patient called needs to speak to nurse rather speak to nurse or provider he has questions.

## 2023-08-30 NOTE — Telephone Encounter (Signed)
Patient calling back again please advise

## 2023-08-30 NOTE — Telephone Encounter (Signed)
Patient LVM 2x still awaiting a phone call from somebody. Please advise Thanks

## 2023-08-31 DIAGNOSIS — K603 Anal fistula, unspecified: Secondary | ICD-10-CM | POA: Diagnosis not present

## 2023-08-31 DIAGNOSIS — K6289 Other specified diseases of anus and rectum: Secondary | ICD-10-CM | POA: Diagnosis not present

## 2023-08-31 DIAGNOSIS — Z8719 Personal history of other diseases of the digestive system: Secondary | ICD-10-CM | POA: Diagnosis not present

## 2023-08-31 DIAGNOSIS — K612 Anorectal abscess: Secondary | ICD-10-CM | POA: Diagnosis not present

## 2023-09-12 ENCOUNTER — Other Ambulatory Visit: Payer: Self-pay | Admitting: Internal Medicine

## 2023-09-12 ENCOUNTER — Telehealth: Payer: Self-pay | Admitting: Internal Medicine

## 2023-09-12 DIAGNOSIS — N522 Drug-induced erectile dysfunction: Secondary | ICD-10-CM

## 2023-09-12 NOTE — Telephone Encounter (Signed)
Refills sent to pharmacy. 

## 2023-09-12 NOTE — Telephone Encounter (Signed)
Prescription Request  09/12/2023  LOV: 08/07/2023  What is the name of the medication or equipment? sildenafil (VIAGRA) 50 MG tablet [[id number]]    Have you contacted your pharmacy to request a refill? Yes   Which pharmacy would you like this sent to?   [facility] [address], [place] - [address] [place] [address] Phone: [phone] Fax: [phone]    Patient notified that their request is being sent to the clinical staff for review and that they should receive a response within 2 business days.   Please advise at [facility] [phone]

## 2023-09-13 ENCOUNTER — Telehealth: Payer: Self-pay | Admitting: Internal Medicine

## 2023-09-13 ENCOUNTER — Other Ambulatory Visit: Payer: Self-pay | Admitting: Internal Medicine

## 2023-09-13 DIAGNOSIS — E1140 Type 2 diabetes mellitus with diabetic neuropathy, unspecified: Secondary | ICD-10-CM

## 2023-09-13 MED ORDER — TRAMADOL HCL 50 MG PO TABS: 50.0000 mg | ORAL_TABLET | Freq: Two times a day (BID) | ORAL | 0 refills | Status: DC | PRN

## 2023-09-13 NOTE — Telephone Encounter (Signed)
Prescription Request  09/13/2023  LOV: 08/07/2023  What is the name of the medication or equipment? traMADol (ULTRAM) 50 MG tablet [[id number]]    Have you contacted your pharmacy to request a refill? Yes   Which pharmacy would you like this sent to?  [facility]   Patient notified that their request is being sent to the clinical staff for review and that they should receive a response within 2 business days.   Please advise at Mobile [phone] (mobile)

## 2023-09-13 NOTE — Telephone Encounter (Signed)
Patient advised.

## 2023-09-17 ENCOUNTER — Encounter (HOSPITAL_BASED_OUTPATIENT_CLINIC_OR_DEPARTMENT_OTHER): Payer: Self-pay | Admitting: Surgery

## 2023-09-17 NOTE — Progress Notes (Signed)
   09/17/23 1210  OBSTRUCTIVE SLEEP APNEA  Have you ever been diagnosed with sleep apnea through a sleep study? No  Do you snore loudly (loud enough to be heard through closed doors)?  1  Do you often feel tired, fatigued, or sleepy during the daytime (such as falling asleep during driving or talking to someone)? 1  Has anyone observed you stop breathing during your sleep? 1  Do you have, or are you being treated for high blood pressure? 1  BMI more than 35 kg/m2? 1  Age > 50 (1-yes) 1  Male Gender (Yes=1) 1  Obstructive Sleep Apnea Score 7  Score 5 or greater  Results sent to PCP

## 2023-09-17 NOTE — Progress Notes (Addendum)
Addendum:   chart reviewed w/ anesthesia, Dr Okey Dupre MDA. Concerning pt doing mounjaro yesterday 11/03 with pt surgery 11/ 07.  Per Dr Okey Dupre MDA , stated pt ok to proceed ,  pt can be intubated.Jeanene Erb and spoke w/ pt to let him know anesthesia recommendation can proceed with surgery but will be intubated.   Spoke w/ via phone for pre-op interview--- pt Lab needs dos----    [employer] results------  current EKG in epic/chart  COVID test -----patient states asymptomatic no test needed Arrive at -------  0730 on 09-20-2023  NPO after MN NO Solid Food.  Clear liquids from MN until--- 0630 Med rec completed Medications to take morning of surgery -----  pt prefer not to take any meds  Diabetic medication ----- do not take jardiance morning of surgery.  Pt stated does mourjaro on Sunday's , last dose 09-16-2023.  Pt stated was not given any instructions about stopping prior to surgery.  Pt verbalized understanding about guideline and why this is needed to stop. Will speak with anesthesia and call pt back with their recommendation.  Patient instructed no nail polish to be worn day of surgery Patient instructed to bring photo id and insurance card day of surgery Patient aware to have Driver (ride ) -- friend, donald/ caregiver    for 24 hours after surgery - sig.other, rozette and mother, lois Patient Special Instructions -----  told pt will call him when dr white enters pre-op orders if rectal prep is ordered Pre-Op special Instructions ----- sent inbox message to dr white, requested orders Stop-bang sleep apnea score= 7 ,  sent / routed to pt's pcp in epic  (however, pt recently tried home sleep study , unsuccessful, due to anxiety) Patient verbalized understanding of instructions that were given at this phone interview. Patient denies chest pain, sob, fever, cough at the interview.

## 2023-09-19 ENCOUNTER — Ambulatory Visit: Payer: Self-pay | Admitting: Surgery
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
## 2023-09-20 ENCOUNTER — Ambulatory Visit: Payer: Medicare HMO | Admitting: General Surgery

## 2023-09-20 ENCOUNTER — Encounter (HOSPITAL_BASED_OUTPATIENT_CLINIC_OR_DEPARTMENT_OTHER)
Admission: RE | Disposition: A | Discharge status: Home / Self Care | Payer: Self-pay | Source: Home / Self Care | Attending: Surgery

## 2023-09-20 ENCOUNTER — Other Ambulatory Visit: Payer: Self-pay

## 2023-09-20 ENCOUNTER — Telehealth: Payer: Self-pay

## 2023-09-20 ENCOUNTER — Encounter (HOSPITAL_BASED_OUTPATIENT_CLINIC_OR_DEPARTMENT_OTHER): Payer: Self-pay | Admitting: Surgery

## 2023-09-20 ENCOUNTER — Ambulatory Visit (HOSPITAL_BASED_OUTPATIENT_CLINIC_OR_DEPARTMENT_OTHER)
Admission: RE | Admit: 2023-09-20 | Discharge: 2023-09-20 | Disposition: A | Discharge status: Home / Self Care | Payer: Medicare HMO | Attending: Surgery | Admitting: Surgery

## 2023-09-20 ENCOUNTER — Ambulatory Visit (HOSPITAL_BASED_OUTPATIENT_CLINIC_OR_DEPARTMENT_OTHER): Payer: Medicare HMO | Admitting: Anesthesiology

## 2023-09-20 DIAGNOSIS — E1140 Type 2 diabetes mellitus with diabetic neuropathy, unspecified: Secondary | ICD-10-CM | POA: Diagnosis not present

## 2023-09-20 DIAGNOSIS — I129 Hypertensive chronic kidney disease with stage 1 through stage 4 chronic kidney disease, or unspecified chronic kidney disease: Secondary | ICD-10-CM | POA: Insufficient documentation

## 2023-09-20 DIAGNOSIS — E1122 Type 2 diabetes mellitus with diabetic chronic kidney disease: Secondary | ICD-10-CM | POA: Insufficient documentation

## 2023-09-20 DIAGNOSIS — Z7985 Long-term (current) use of injectable non-insulin antidiabetic drugs: Secondary | ICD-10-CM | POA: Insufficient documentation

## 2023-09-20 DIAGNOSIS — E782 Mixed hyperlipidemia: Secondary | ICD-10-CM | POA: Insufficient documentation

## 2023-09-20 DIAGNOSIS — N183 Chronic kidney disease, stage 3 unspecified: Secondary | ICD-10-CM | POA: Diagnosis not present

## 2023-09-20 DIAGNOSIS — F32A Depression, unspecified: Secondary | ICD-10-CM | POA: Insufficient documentation

## 2023-09-20 DIAGNOSIS — K603 Anal fistula, unspecified: Secondary | ICD-10-CM | POA: Diagnosis not present

## 2023-09-20 DIAGNOSIS — K60321 Anal fistula, complex, initial: Secondary | ICD-10-CM | POA: Diagnosis not present

## 2023-09-20 DIAGNOSIS — I10 Essential (primary) hypertension: Secondary | ICD-10-CM | POA: Diagnosis not present

## 2023-09-20 DIAGNOSIS — E119 Type 2 diabetes mellitus without complications: Secondary | ICD-10-CM | POA: Diagnosis not present

## 2023-09-20 DIAGNOSIS — Z01818 Encounter for other preprocedural examination: Secondary | ICD-10-CM

## 2023-09-20 DIAGNOSIS — Z7984 Long term (current) use of oral hypoglycemic drugs: Secondary | ICD-10-CM | POA: Diagnosis not present

## 2023-09-20 HISTORY — DX: Chronic idiopathic constipation: K59.04

## 2023-09-20 HISTORY — DX: Type 2 diabetes mellitus with other diabetic kidney complication: E11.29

## 2023-09-20 HISTORY — DX: Proteinuria, unspecified: R80.9

## 2023-09-20 HISTORY — PX: FISTULOTOMY: SHX6413

## 2023-09-20 HISTORY — DX: Other cervical disc degeneration, unspecified cervical region: M50.30

## 2023-09-20 HISTORY — DX: Type 2 diabetes mellitus without complications: E11.9

## 2023-09-20 HISTORY — DX: Personal history of urinary calculi: Z87.442

## 2023-09-20 HISTORY — DX: Post-traumatic stress disorder, unspecified: F43.10

## 2023-09-20 HISTORY — DX: Anal fistula, unspecified: K60.30

## 2023-09-20 HISTORY — DX: Unspecified right bundle-branch block: I45.10

## 2023-09-20 HISTORY — DX: Chronic fatigue, unspecified: R53.82

## 2023-09-20 HISTORY — DX: Male erectile dysfunction, unspecified: N52.9

## 2023-09-20 HISTORY — DX: Testicular hypofunction: E29.1

## 2023-09-20 HISTORY — DX: Other specified personal risk factors, not elsewhere classified: Z91.89

## 2023-09-20 HISTORY — DX: Mixed hyperlipidemia: E78.2

## 2023-09-20 HISTORY — DX: Essential (primary) hypertension: I10

## 2023-09-20 HISTORY — DX: Insomnia, unspecified: G47.00

## 2023-09-20 LAB — POCT I-STAT, CHEM 8
BUN: 25 mg/dL — ABNORMAL HIGH (ref 6–20)
Calcium, Ion: 1.24 mmol/L (ref 1.15–1.40)
Chloride: 105 mmol/L (ref 98–111)
Creatinine, Ser: 1.8 mg/dL — ABNORMAL HIGH (ref 0.61–1.24)
Glucose, Bld: 130 mg/dL — ABNORMAL HIGH (ref 70–99)
HCT: 50 % (ref 39.0–52.0)
Hemoglobin: 17 g/dL (ref 13.0–17.0)
Potassium: 4 mmol/L (ref 3.5–5.1)
Sodium: 140 mmol/L (ref 135–145)
TCO2: 20 mmol/L — ABNORMAL LOW (ref 22–32)

## 2023-09-20 LAB — GLUCOSE, CAPILLARY: Glucose-Capillary: 134 mg/dL — ABNORMAL HIGH (ref 70–99)

## 2023-09-20 SURGERY — FISTULOTOMY
Anesthesia: General | Site: Rectum

## 2023-09-20 MED ORDER — FLEET ENEMA RE ENEM: 1.0000 | ENEMA | Freq: Once | RECTAL | Status: DC

## 2023-09-20 MED ORDER — FENTANYL CITRATE (PF) 100 MCG/2ML IJ SOLN
INTRAMUSCULAR | Status: AC
  Filled 2023-09-20: qty 2

## 2023-09-20 MED ORDER — ACETAMINOPHEN 500 MG PO TABS
1000.0000 mg | ORAL_TABLET | ORAL | Status: AC
Start: 2023-09-20 — End: 2023-09-20
  Administered 2023-09-20: 1000 mg via ORAL

## 2023-09-20 MED ORDER — ACETAMINOPHEN 160 MG/5ML PO SOLN
325.0000 mg | Freq: Once | ORAL | Status: DC | PRN
Start: 2023-09-20 — End: 2023-09-20

## 2023-09-20 MED ORDER — MIDAZOLAM HCL 5 MG/5ML IJ SOLN
INTRAMUSCULAR | Status: DC | PRN
  Administered 2023-09-20: 2 mg via INTRAVENOUS

## 2023-09-20 MED ORDER — PROPOFOL 10 MG/ML IV BOLUS
INTRAVENOUS | Status: AC
  Filled 2023-09-20: qty 20

## 2023-09-20 MED ORDER — MEPERIDINE HCL 25 MG/ML IJ SOLN: 6.2500 mg | INTRAMUSCULAR | Status: DC | PRN

## 2023-09-20 MED ORDER — BUPIVACAINE LIPOSOME 1.3 % IJ SUSP
20.0000 mL | Freq: Once | INTRAMUSCULAR | Status: DC
Start: 2023-09-20 — End: 2023-09-20

## 2023-09-20 MED ORDER — DEXAMETHASONE SODIUM PHOSPHATE 10 MG/ML IJ SOLN
INTRAMUSCULAR | Status: DC | PRN
  Administered 2023-09-20: 5 mg via INTRAVENOUS

## 2023-09-20 MED ORDER — PROPOFOL 10 MG/ML IV BOLUS
INTRAVENOUS | Status: DC | PRN
  Administered 2023-09-20 (×2): 50 mg via INTRAVENOUS
  Administered 2023-09-20: 200 mg via INTRAVENOUS
  Administered 2023-09-20: 50 mg via INTRAVENOUS
  Administered 2023-09-20: 20 mg via INTRAVENOUS

## 2023-09-20 MED ORDER — LIDOCAINE 2% (20 MG/ML) 5 ML SYRINGE
INTRAMUSCULAR | Status: DC | PRN
  Administered 2023-09-20: 40 mg via INTRAVENOUS

## 2023-09-20 MED ORDER — MIDAZOLAM HCL 2 MG/2ML IJ SOLN
INTRAMUSCULAR | Status: AC
  Filled 2023-09-20: qty 2

## 2023-09-20 MED ORDER — HYDROMORPHONE HCL 1 MG/ML IJ SOLN: 0.2500 mg | INTRAMUSCULAR | Status: DC | PRN

## 2023-09-20 MED ORDER — ACETAMINOPHEN 500 MG PO TABS
ORAL_TABLET | ORAL | Status: AC
  Filled 2023-09-20: qty 2

## 2023-09-20 MED ORDER — BUPIVACAINE-EPINEPHRINE 0.25% -1:200000 IJ SOLN
INTRAMUSCULAR | Status: DC | PRN
  Administered 2023-09-20: 50 mL via SURGICAL_CAVITY

## 2023-09-20 MED ORDER — LACTATED RINGERS IV SOLN: INTRAVENOUS | Status: DC

## 2023-09-20 MED ORDER — DROPERIDOL 2.5 MG/ML IJ SOLN: 0.6250 mg | Freq: Once | INTRAMUSCULAR | Status: DC | PRN

## 2023-09-20 MED ORDER — STERILE WATER FOR IRRIGATION IR SOLN
Status: DC | PRN
  Administered 2023-09-20: 500 mL

## 2023-09-20 MED ORDER — ACETAMINOPHEN 10 MG/ML IV SOLN: 1000.0000 mg | Freq: Once | INTRAVENOUS | Status: DC | PRN

## 2023-09-20 MED ORDER — ONDANSETRON HCL 4 MG/2ML IJ SOLN
INTRAMUSCULAR | Status: DC | PRN
  Administered 2023-09-20: 4 mg via INTRAVENOUS

## 2023-09-20 MED ORDER — ONDANSETRON HCL 4 MG/2ML IJ SOLN
INTRAMUSCULAR | Status: AC
  Filled 2023-09-20: qty 2

## 2023-09-20 MED ORDER — FENTANYL CITRATE (PF) 100 MCG/2ML IJ SOLN
INTRAMUSCULAR | Status: DC | PRN
  Administered 2023-09-20 (×2): 50 ug via INTRAVENOUS

## 2023-09-20 MED ORDER — DEXAMETHASONE SODIUM PHOSPHATE 10 MG/ML IJ SOLN
INTRAMUSCULAR | Status: AC
  Filled 2023-09-20: qty 1

## 2023-09-20 MED ORDER — ACETAMINOPHEN 325 MG PO TABS
325.0000 mg | ORAL_TABLET | Freq: Once | ORAL | Status: DC | PRN
Start: 2023-09-20 — End: 2023-09-20

## 2023-09-20 MED ORDER — SODIUM CHLORIDE 0.9 % IV SOLN: INTRAVENOUS | Status: DC

## 2023-09-20 SURGICAL SUPPLY — 61 items
APL SKNCLS STERI-STRIP NONHPOA (GAUZE/BANDAGES/DRESSINGS)
BENZOIN TINCTURE PRP APPL 2/3 (GAUZE/BANDAGES/DRESSINGS) ×1 IMPLANT
BLADE EXTENDED COATED 6.5IN (ELECTRODE) IMPLANT
BLADE SURG 10 STRL SS (BLADE) IMPLANT
BLADE SURG 15 STRL LF DISP TIS (BLADE) ×1 IMPLANT
BLADE SURG 15 STRL SS (BLADE)
BRIEF MESH DISP LRG (UNDERPADS AND DIAPERS) ×2 IMPLANT
COVER BACK TABLE 60X90IN (DRAPES) ×2 IMPLANT
COVER MAYO STAND STRL (DRAPES) ×2 IMPLANT
DRAPE HYSTEROSCOPY (MISCELLANEOUS) ×1 IMPLANT
DRAPE LAPAROTOMY 100X72 PEDS (DRAPES) ×1 IMPLANT
DRAPE SHEET LG 3/4 BI-LAMINATE (DRAPES) ×1 IMPLANT
DRAPE UTILITY XL STRL (DRAPES) ×1 IMPLANT
GAUZE 4X4 16PLY ~~LOC~~+RFID DBL (SPONGE) ×1 IMPLANT
GAUZE PAD ABD 8X10 STRL (GAUZE/BANDAGES/DRESSINGS) ×2 IMPLANT
GAUZE SPONGE 4X4 12PLY STRL (GAUZE/BANDAGES/DRESSINGS) IMPLANT
GLOVE BIO SURGEON STRL SZ7.5 (GLOVE) ×2 IMPLANT
GLOVE INDICATOR 8.0 STRL GRN (GLOVE) ×2 IMPLANT
GOWN STRL REUS W/TWL XL LVL3 (GOWN DISPOSABLE) ×2 IMPLANT
HYDROGEN PEROXIDE 16OZ (MISCELLANEOUS) IMPLANT
IV CATH 14GX2 1/4 (CATHETERS) IMPLANT
IV CATH 18G SAFETY (IV SOLUTION) IMPLANT
KIT SIGMOIDOSCOPE (SET/KITS/TRAYS/PACK) IMPLANT
KIT TURNOVER CYSTO (KITS) ×2 IMPLANT
LEGGING LITHOTOMY PAIR STRL (DRAPES) ×1 IMPLANT
LOOP VASCLR MAXI BLUE 18IN ST (MISCELLANEOUS) IMPLANT
NDL HYPO 22X1.5 SAFETY MO (MISCELLANEOUS) ×1 IMPLANT
NDL HYPO 25X1 1.5 SAFETY (NEEDLE) IMPLANT
NDL SAFETY ECLIPSE 18X1.5 (NEEDLE) IMPLANT
NEEDLE HYPO 22X1.5 SAFETY MO (MISCELLANEOUS) ×2
NEEDLE HYPO 25X1 1.5 SAFETY (NEEDLE)
NS IRRIG 500ML POUR BTL (IV SOLUTION) ×1 IMPLANT
PACK BASIN DAY SURGERY FS (CUSTOM PROCEDURE TRAY) ×2 IMPLANT
PENCIL SMOKE EVACUATOR (MISCELLANEOUS) ×2 IMPLANT
SLEEVE SCD COMPRESS KNEE MED (STOCKING) ×2 IMPLANT
SPIKE FLUID TRANSFER (MISCELLANEOUS) ×1 IMPLANT
SPONGE HEMORRHOID 8X3CM (HEMOSTASIS) IMPLANT
SPONGE SURGIFOAM ABS GEL 12-7 (HEMOSTASIS) IMPLANT
SUT CHROMIC 2 0 SH (SUTURE) IMPLANT
SUT CHROMIC 3 0 SH 27 (SUTURE) IMPLANT
SUT MNCRL AB 4-0 PS2 18 (SUTURE) IMPLANT
SUT SILK 0 TIES 10X30 (SUTURE) ×1 IMPLANT
SUT SILK 2 0 (SUTURE)
SUT SILK 2 0 SH (SUTURE) ×1 IMPLANT
SUT SILK 2-0 18XBRD TIE 12 (SUTURE) IMPLANT
SUT VIC AB 2-0 SH 27 (SUTURE)
SUT VIC AB 2-0 SH 27XBRD (SUTURE) IMPLANT
SUT VIC AB 3-0 SH 18 (SUTURE) IMPLANT
SUT VIC AB 3-0 SH 27 (SUTURE)
SUT VIC AB 3-0 SH 27X BRD (SUTURE) IMPLANT
SUT VIC AB 3-0 SH 27XBRD (SUTURE) IMPLANT
SYR 20ML LL LF (SYRINGE) IMPLANT
SYR BULB IRRIG 60ML STRL (SYRINGE) ×1 IMPLANT
SYR CONTROL 10ML LL (SYRINGE) ×2 IMPLANT
SYR TB 1ML LL NO SAFETY (SYRINGE) IMPLANT
TOWEL OR 17X24 6PK STRL BLUE (TOWEL DISPOSABLE) ×2 IMPLANT
TRAY DSU PREP LF (CUSTOM PROCEDURE TRAY) ×2 IMPLANT
TUBE CONNECTING 12X1/4 (SUCTIONS) ×2 IMPLANT
VASCULAR TIE MAXI BLUE 18IN ST (MISCELLANEOUS)
WATER STERILE IRR 500ML POUR (IV SOLUTION) ×1 IMPLANT
YANKAUER SUCT BULB TIP NO VENT (SUCTIONS) ×2 IMPLANT

## 2023-09-20 NOTE — Discharge Instructions (Addendum)
ANORECTAL SURGERY: POST OP INSTRUCTIONS  DIET: Follow a light bland diet the first 24 hours after arrival home, such as soup, liquids, crackers, etc.  Be sure to include lots of fluids daily.  Avoid fast food or heavy meals as your are more likely to get nauseated.  Eat a low fat diet the next few days after surgery.   Some bleeding with bowel movements is expected for the first couple of days but this should stop in between bowel movements  Take your usually prescribed home medications unless otherwise directed. No foreign bodies per rectum for the next 3 months (enemas, etc)  PAIN CONTROL: It is helpful to take an over-the-counter pain medication regularly for the first few days/weeks.  Choose from the following that works best for you: Ibuprofen (Advil, etc) Three 200mg  tabs every 6 hours as needed. Acetaminophen (Tylenol, etc) 500-650mg  every 6 hours as needed NOTE: You may take both of these medications together - most patients find it most helpful when alternating between the two (i.e. Ibuprofen at 6am, tylenol at 9am, ibuprofen at 12pm ..Marland Kitchen) A  prescription for pain medication may have been prescribed for you at discharge.  Take your pain medication as prescribed.  If you are having problems/concerns with the prescription medicine, please call us for further advice.  Avoid getting constipated.  Between the surgery and the pain medications, it is common to experience some constipation.  Increasing fluid intake (64oz of water per day) and taking a fiber supplement (such as Metamucil, Citrucel, FiberCon) 1-2 times a day regularly will usually help prevent this problem from occurring.  Take Miralax (over the counter) 1-2x/day while taking a narcotic pain medication. If no bowel movement after 48hours, you may additionally take a laxative like a bottle of Milk of Magnesia which can be purchased over the counter. Avoid enemas.   Watch out for diarrhea.  If you have many loose bowel movements,  simplify your diet to bland foods.  Stop any stool softeners and decrease your fiber supplement. If this worsens or does not improve, please call us.  Wash / shower every day.  If you were discharged with a dressing, you may remove this the day after your surgery. You may shower normally, getting soap/water on your wound, particularly after bowel movements.  Soaking in a warm bath filled a couple inches ("Sitz bath") is a great way to clean the area after a bowel movement and many patients find it is a way to soothe the area.  ACTIVITIES as tolerated:   You may resume regular (light) daily activities beginning the next day--such as daily self-care, walking, climbing stairs--gradually increasing activities as tolerated.  If you can walk 30 minutes without difficulty, it is safe to try more intense activity such as jogging, treadmill, bicycling, low-impact aerobics, etc. Refrain from any heavy lifting or straining for the first 2 weeks after your procedure, particularly if your surgery was for hemorrhoids. Avoid activities that make your pain worse You may drive when you are no longer taking prescription pain medication, you can comfortably wear a seatbelt, and you can safely maneuver your car and apply brakes.  FOLLOW UP in our office Please call [facility] at [phone] to set up an appointment to see your surgeon in the office for a follow-up appointment approximately 2 weeks after your surgery. Make sure that you call for this appointment the day you arrive home to insure a convenient appointment time.  9. If you have disability or family leave forms  that need to be completed, you may have them completed by your primary care physician's office; for return to work instructions, please ask our office staff and they will be happy to assist you in obtaining this documentation   When to call us [phone]: Poor pain control Reactions / problems with new medications (rash/itching, etc)  Fever over  101.5 F (38.5 C) Inability to urinate Nausea/vomiting Worsening swelling or bruising Continued bleeding from incision. Increased pain, redness, or drainage from the incision  The clinic staff is available to answer your questions during regular business hours (8:30am-5pm).  Please don't hesitate to call and ask to speak to one of our nurses for clinical concerns.   A surgeon from [facility] is always on call at the hospitals   If you have a medical emergency, go to the nearest emergency room or call 911.   [facility] [address], [address], [place], [place]  [address] MAIN: [phone] FAX: [phone] [email or web address]   Post Anesthesia Home Care Instructions  Activity: Get plenty of rest for the remainder of the day. A responsible individual must stay with you for 24 hours following the procedure.  For the next 24 hours, DO NOT: -Drive a car -Advertising copywriter -Drink alcoholic beverages -Take any medication unless instructed by your physician -Make any legal decisions or sign important papers.  Meals: Start with liquid foods such as gelatin or soup. Progress to regular foods as tolerated. Avoid greasy, spicy, heavy foods. If nausea and/or vomiting occur, drink only clear liquids until the nausea and/or vomiting subsides. Call your physician if vomiting continues.  Special Instructions/Symptoms: Your throat may feel dry or sore from the anesthesia or the breathing tube placed in your throat during surgery. If this causes discomfort, gargle with warm salt water. The discomfort should disappear within 24 hours.  Information for Discharge Teaching: EXPAREL (bupivacaine liposome injectable suspension)   Pain relief is important to your recovery. The goal is to control your pain so you can move easier and return to your normal activities as soon as possible after your procedure. Your physician may use  several types of medicines to manage pain, swelling, and more.  Your surgeon or anesthesiologist gave you EXPAREL(bupivacaine) to help control your pain after surgery.  EXPAREL is a local anesthetic designed to release slowly over an extended period of time to provide pain relief by numbing the tissue around the surgical site. EXPAREL is designed to release pain medication over time and can control pain for up to 72 hours. Depending on how you respond to EXPAREL, you may require less pain medication during your recovery. EXPAREL can help reduce or eliminate the need for opioids during the first few days after surgery when pain relief is needed the most. EXPAREL is not an opioid and is not addictive. It does not cause sleepiness or sedation.   Important! A teal colored band has been placed on your arm with the date, time and amount of EXPAREL you have received. Please leave this armband in place for the full 96 hours following administration, and then you may remove the band. If you return to the hospital for any reason within 96 hours following the administration of EXPAREL, the armband provides important information that your health care providers to know, and alerts them that you have received this anesthetic.    Possible side effects of EXPAREL: Temporary loss of sensation or ability to move in the area where medication  was injected. Nausea, vomiting, constipation Rarely, numbness and tingling in your mouth or lips, lightheadedness, or anxiety may occur. Call your doctor right away if you think you may be experiencing any of these sensations, or if you have other questions regarding possible side effects.  Follow all other discharge instructions given to you by your surgeon or nurse. Eat a healthy diet and drink plenty of water or other fluids.  Do not remove green armband before Monday, September 24, 2023.

## 2023-09-20 NOTE — Op Note (Signed)
09/20/2023  9:50 AM  PATIENT:  Francisco Huffman  52 y.o. male  Patient Care Team: Anabel Halon, MD as PCP - General (Internal Medicine) Wyline Mood, Dorothe Pea, MD as PCP - Cardiology (Cardiology) Doreatha Massed, MD as Medical Oncologist (Hematology) [facility], [facility] (Optometry)  PRE-OPERATIVE DIAGNOSIS:  Anal fistula  POST-OPERATIVE DIAGNOSIS:  Intersphincteric anal fistula  PROCEDURE:   Surgical treatment of intersphincteric anal fistula with fistulotomy Anorectal exam under anesthesia  SURGEON:  Surgeon(s): Andria Meuse, MD  ASSISTANT: OR Staff   ANESTHESIA:   local and general  SPECIMEN:  No Specimen  DISPOSITION OF SPECIMEN:  N/A  COUNTS:  Sponge, needle, and instrument counts were reported correct x2 at conclusion.  EBL: 10 mL  PLAN OF CARE: Discharge to home after PACU  PATIENT DISPOSITION:  PACU - hemodynamically stable.  OR FINDINGS: Anal fistula with external opening right posterior and internal opening in the posterior midline.  Internal opening is well distal to the dentate line.  Exam under anesthesia demonstrates this fistula to involve a small portion of the external sphincter muscle but does not appear to involve any significant portion of the internal sphincter muscle, intersphincteric variety.  There is also <10% of the sphincter muscle involved.  Therefore, we felt this was most amenable to an upfront fistulotomy.  DESCRIPTION: The patient was identified in the preoperative holding area and taken to the OR where he was placed on the operating room table. SCDs were placed.  General LMA anesthesia was induced without difficulty. The patient was then positioned in high lithotomy with Allen stirrups. Pressure points were then evaluated and padded.  He was then prepped and draped in usual sterile fashion.  A surgical timeout was performed indicating the correct patient, procedure, and positioning.  A perianal block was performed using a dilute  mixture of 0.25% Marcaine with epinephrine and Exparel.   After ascertaining that an appropriate level of anesthesia had been achieved, a well lubricated digital rectal exam was performed. This demonstrated no palpable masses, fullness, or other abnormalities.  Good tone.  A Hill-Ferguson anoscope was into the anal canal and circumferential inspection demonstrated healthy appearing anoderm.  Some scarring in the posterior midline potentially related to prior anal fissure.  Externally, in the right posterior position there is an obvious external opening with chronic granulation tissue.  There is no other perianal skin abnormalities.  The external opening is then carefully interrogated using a flexible fistula probe.  We are able to demonstrate that this does communicate with the posterior midline without any difficulty.  Care was taken to avoid creating any false passages or tracts.  The overlying skin is then palpated.  There is a small bundle of external sphincter muscle involved in this tract but this does course quite shallow through the perianal tissues.  The internal opening is well distal to the dentate line.  This is a shallow intersphincteric type fistula.  We felt this was most amenable to an upfront fistulotomy.  Additional local anesthetic is infiltrated around the tract.  The skin is incised sharply and a fistulotomy is carried out using electrocautery.  Again, minimal amount of sphincter muscle is involved in this (<10%).  The tract is then evaluated.  Hemostasis is verified.  We did remove chronic granulation type tissue using fulguration.  The area was irrigated.  Hemostasis is verified.  All sponge, needle, and instrument counts were reported correct.  He was taken out of lithotomy position and a dressing consisting of 4  x 4's, ABD, and mesh underwear was placed.  He was awakened from anesthesia, extubated, and transported to the recovery room in satisfactory condition.  DISPOSITION: PACU in  satisfactory condition.

## 2023-09-20 NOTE — Transfer of Care (Signed)
Immediate Anesthesia Transfer of Care Note  Patient: Francisco Huffman  Procedure(s) Performed: SURGICAL  TREATMENT OF ANAL FUSTULA INTERSPHERTERIC  WITH FISTULOTOMY (Rectum) EXAM UNDER ANESTHESIA (Rectum)  Patient Location: PACU  Anesthesia Type:General  Level of Consciousness: awake, alert , oriented, and patient cooperative  Airway & Oxygen Therapy: Patient Spontanous Breathing  Post-op Assessment: Report given to RN and Post -op Vital signs reviewed and stable  Post vital signs: Reviewed and stable  Last Vitals:  Vitals Value Taken Time  BP 151/90 09/20/23 0950  Temp    Pulse 101 09/20/23 0952  Resp 22 09/20/23 0951  SpO2 91 % 09/20/23 0952  Vitals shown include unfiled device data.  Last Pain:  Vitals:   09/20/23 0800  TempSrc: Oral  PainSc: 2       Patients Stated Pain Goal: 2 (09/20/23 0800)  Complications: No notable events documented.

## 2023-09-20 NOTE — Anesthesia Postprocedure Evaluation (Signed)
Anesthesia Post Note  Patient: Silvino Selman Mcgillis  Procedure(s) Performed: SURGICAL  TREATMENT OF ANAL FUSTULA INTERSPHERTERIC  WITH FISTULOTOMY (Rectum) EXAM UNDER ANESTHESIA (Rectum)     Patient location during evaluation: PACU Anesthesia Type: General Level of consciousness: awake and alert Pain management: pain level controlled Vital Signs Assessment: post-procedure vital signs reviewed and stable Respiratory status: spontaneous breathing, nonlabored ventilation, respiratory function stable and patient connected to nasal cannula oxygen Cardiovascular status: blood pressure returned to baseline and stable Postop Assessment: no apparent nausea or vomiting Anesthetic complications: no  No notable events documented.  Last Vitals:  Vitals:   09/20/23 1048 09/20/23 1115  BP:  122/82  Pulse:  78  Resp:  20  Temp: 36.8 C   SpO2:  97%    Last Pain:  Vitals:   09/20/23 1115  TempSrc:   PainSc: 0-No pain                 Shelton Silvas

## 2023-09-20 NOTE — Anesthesia Procedure Notes (Signed)
Procedure Name: LMA Insertion Date/Time: 09/20/2023 9:11 AM  Performed by: Bishop Limbo, CRNAPre-anesthesia Checklist: Patient identified, Emergency Drugs available, Suction available and Patient being monitored Patient Re-evaluated:Patient Re-evaluated prior to induction Oxygen Delivery Method: Circle System Utilized Preoxygenation: Pre-oxygenation with 100% oxygen Induction Type: IV induction Ventilation: Mask ventilation without difficulty LMA: LMA inserted LMA Size: 5.0 Number of attempts: 1 Airway Equipment and Method: Bite block Placement Confirmation: positive ETCO2 Tube secured with: Tape Dental Injury: Teeth and Oropharynx as per pre-operative assessment

## 2023-09-20 NOTE — Anesthesia Preprocedure Evaluation (Addendum)
Anesthesia Evaluation  Patient identified by MRN, date of birth, ID band Patient awake    Reviewed: Allergy & Precautions, NPO status , Patient's Chart, lab work & pertinent test results  Airway Mallampati: I  TM Distance: >3 FB Neck ROM: Full    Dental  (+) Dental Advisory Given, Poor Dentition, Chipped   Pulmonary sleep apnea    breath sounds clear to auscultation       Cardiovascular hypertension, Pt. on medications + dysrhythmias  Rhythm:Regular Rate:Normal     Neuro/Psych  PSYCHIATRIC DISORDERS Anxiety     negative neurological ROS     GI/Hepatic negative GI ROS, Neg liver ROS,,,  Endo/Other  diabetes, Type 2, Oral Hypoglycemic Agents    Renal/GU Renal disease     Musculoskeletal  (+) Arthritis ,    Abdominal   Peds  Hematology negative hematology ROS (+)   Anesthesia Other Findings - HLD  Reproductive/Obstetrics                             Anesthesia Physical Anesthesia Plan  ASA: 3  Anesthesia Plan: General   Post-op Pain Management: Tylenol PO (pre-op)* and Toradol IV (intra-op)*   Induction: Intravenous  PONV Risk Score and Plan: 3 and Ondansetron, Dexamethasone and Midazolam  Airway Management Planned: Oral ETT  Additional Equipment: None  Intra-op Plan:   Post-operative Plan: Extubation in OR  Informed Consent: I have reviewed the patients History and Physical, chart, labs and discussed the procedure including the risks, benefits and alternatives for the proposed anesthesia with the patient or authorized representative who has indicated his/her understanding and acceptance.     Dental advisory given  Plan Discussed with: CRNA  Anesthesia Plan Comments:        Anesthesia Quick Evaluation

## 2023-09-20 NOTE — Telephone Encounter (Signed)
Copied from CRM [id number]. Topic: General - Other >> Sep 20, 2023  4:09 PM Larwance Sachs wrote: Reason for CRM: Patient called in to inform Dr. Allena Katz he is no longer interested testosterone treatment lab. Patient stated he had surgery earlier today and just wanted to confirm if lab work was needed for upcoming appointment

## 2023-09-21 ENCOUNTER — Telehealth: Payer: Self-pay | Admitting: Internal Medicine

## 2023-09-21 NOTE — Telephone Encounter (Signed)
lmtrc

## 2023-09-21 NOTE — Telephone Encounter (Signed)
Copied from CRM [id number]. Topic: Clinical - Medication Question >> Sep 21, 2023  1:05 PM Mosetta Putt H wrote:   Reason for CRM: need to speak with nurse for different reasons

## 2023-09-24 ENCOUNTER — Encounter (HOSPITAL_BASED_OUTPATIENT_CLINIC_OR_DEPARTMENT_OTHER): Payer: Self-pay | Admitting: Surgery

## 2023-09-25 ENCOUNTER — Telehealth: Payer: Self-pay | Admitting: Internal Medicine

## 2023-09-25 NOTE — Telephone Encounter (Signed)
Patient advised no blood work is needed

## 2023-09-25 NOTE — Telephone Encounter (Signed)
Copied from CRM [id number]. Topic: General - Other >> Sep 25, 2023 10:12 AM Prudencio Pair wrote: Reason for CRM: Patient is calling to see if he has to do lab work before his upcoming appointment. States he has called twice last week and no one has contacted him. He is no longer interested in testosterone treatment lab. Patient had surgery last week. Patient is requesting a call back. CB # [phone].

## 2023-10-01 ENCOUNTER — Telehealth: Payer: Self-pay

## 2023-10-01 NOTE — Telephone Encounter (Signed)
lmtrc

## 2023-10-01 NOTE — Telephone Encounter (Signed)
Copied from CRM [id number]. Topic: Clinical - Medication Question >> Oct 01, 2023  1:40 PM Almira Coaster wrote: Reason for CRM: Pt is calling to follow up on his request from Friday 09/28/2023, he  would like the Hydrocodone 5mg  instead of traMADol (ULTRAM) 50 MG tablet and would like to stop taking this medication.

## 2023-10-12 ENCOUNTER — Other Ambulatory Visit: Payer: Self-pay | Admitting: Internal Medicine

## 2023-10-12 DIAGNOSIS — E1140 Type 2 diabetes mellitus with diabetic neuropathy, unspecified: Secondary | ICD-10-CM

## 2023-10-17 ENCOUNTER — Telehealth: Payer: Self-pay | Admitting: Internal Medicine

## 2023-10-17 NOTE — Telephone Encounter (Signed)
Placard   Copied Noted Sleeved (put in provider box)  Call  patient when ready

## 2023-10-17 NOTE — Telephone Encounter (Signed)
Certificate of Disability form  Copied Noted Sleeved (put in provider box)  Call patient when ready and he will pick it up

## 2023-10-18 ENCOUNTER — Encounter: Payer: Self-pay | Admitting: Internal Medicine

## 2023-10-18 ENCOUNTER — Ambulatory Visit (INDEPENDENT_AMBULATORY_CARE_PROVIDER_SITE_OTHER): Payer: Medicare HMO | Admitting: Internal Medicine

## 2023-10-18 VITALS — BP 121/79 | HR 89 | Ht 74.0 in | Wt 268.4 lb

## 2023-10-18 DIAGNOSIS — M7730 Calcaneal spur, unspecified foot: Secondary | ICD-10-CM

## 2023-10-18 DIAGNOSIS — Z7984 Long term (current) use of oral hypoglycemic drugs: Secondary | ICD-10-CM | POA: Diagnosis not present

## 2023-10-18 DIAGNOSIS — E1140 Type 2 diabetes mellitus with diabetic neuropathy, unspecified: Secondary | ICD-10-CM | POA: Diagnosis not present

## 2023-10-18 DIAGNOSIS — Z7985 Long-term (current) use of injectable non-insulin antidiabetic drugs: Secondary | ICD-10-CM

## 2023-10-18 DIAGNOSIS — N522 Drug-induced erectile dysfunction: Secondary | ICD-10-CM

## 2023-10-18 DIAGNOSIS — G4700 Insomnia, unspecified: Secondary | ICD-10-CM | POA: Diagnosis not present

## 2023-10-18 DIAGNOSIS — I10 Essential (primary) hypertension: Secondary | ICD-10-CM

## 2023-10-18 MED ORDER — OXYCODONE-ACETAMINOPHEN 5-325 MG PO TABS: 1.0000 | ORAL_TABLET | Freq: Three times a day (TID) | ORAL | 0 refills | Status: DC | PRN

## 2023-10-18 MED ORDER — SILDENAFIL CITRATE 100 MG PO TABS: 100.0000 mg | ORAL_TABLET | Freq: Every day | ORAL | 0 refills | Status: DC | PRN

## 2023-10-18 MED ORDER — TRAZODONE HCL 150 MG PO TABS: 225.0000 mg | ORAL_TABLET | Freq: Every evening | ORAL | 1 refills | Status: DC | PRN

## 2023-10-18 NOTE — Assessment & Plan Note (Addendum)
Lab Results  Component Value Date   HGBA1C 6.5 (H) 05/14/2023   Well-controlled On Jardiance 25 mg QD On Mounjaro 10 mg qw, plan to increase dose as tolerated Advised to follow diabetic diet On statin and ARB F/u CMP and lipid panel Diabetic eye exam: Advised to follow up with Ophthalmology for diabetic eye exam  On Lyrica to 50 mg BID for DM neuropathy as he had constipation with higher dose and did not see much difference in pain control Percocet PRN for severe pain, discontinue tramadol

## 2023-10-18 NOTE — Assessment & Plan Note (Signed)
H/o heel spur Had referred to Podiatry for further management Heating pad/ice Tylenol/Ibuprofen PRN Percocet as needed for severe pain

## 2023-10-18 NOTE — Assessment & Plan Note (Addendum)
Due to multiple medications started recently Does not get adequate response with sildenafil 50 mg as needed Sildenafil - increased dose to 100 mg PRN

## 2023-10-18 NOTE — Assessment & Plan Note (Signed)
BP Readings from Last 1 Encounters:  10/18/23 121/79   Well controlled with losartan 50 mg QD Counseled for compliance with the medications Advised DASH diet and moderate exercise/walking, at least 150 mins/week
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
## 2023-10-18 NOTE — Assessment & Plan Note (Addendum)
Had been smoking marijuana for anxiety/insomnia Sleep hygiene material provided On Trazodone 150 mg at bedtime, but still has difficulty maintaining sleep -increased dose to 225 mg qHS

## 2023-10-18 NOTE — Patient Instructions (Addendum)
Please take Trazodone 1.5 tablet at bedtime for insomnia.  Please take Percocet as needed for severe foot pain.  Please continue to take other medications as prescribed.  Please continue to follow low carb diet and perform moderate exercise/walking at least 150 mins/week.

## 2023-10-20 LAB — BAYER DCA HB A1C WAIVED: HB A1C (BAYER DCA - WAIVED): 5.9 % — ABNORMAL HIGH (ref 4.8–5.6)

## 2023-11-01 ENCOUNTER — Inpatient Hospital Stay: Payer: Medicare HMO

## 2023-11-08 ENCOUNTER — Inpatient Hospital Stay: Payer: Medicare HMO | Admitting: Oncology

## 2023-11-12 ENCOUNTER — Ambulatory Visit: Payer: Medicare HMO | Admitting: Internal Medicine

## 2023-11-15 ENCOUNTER — Telehealth: Payer: Self-pay

## 2023-11-15 DIAGNOSIS — T798XXA Other early complications of trauma, initial encounter: Secondary | ICD-10-CM | POA: Diagnosis not present

## 2023-11-15 NOTE — Telephone Encounter (Signed)
 Left detailed message.

## 2023-11-15 NOTE — Telephone Encounter (Signed)
 Copied from CRM [id number]. Topic: Clinical - Medical Advice >> Nov 15, 2023  9:41 AM Francisco Huffman wrote: Reason for CRM: pt drop glass on top of left big toe on Christmas eve. The glass cut 2 inch and now it's infection and it's looks ugly. Pt has been treating the infection. Pt would like for Dr. Tobie to reach out regarding what should pt do. If he should go to urgent care or be seen by Dr. Tobie

## 2023-11-20 DIAGNOSIS — E669 Obesity, unspecified: Secondary | ICD-10-CM | POA: Diagnosis not present

## 2023-11-20 DIAGNOSIS — Z6833 Body mass index (BMI) 33.0-33.9, adult: Secondary | ICD-10-CM | POA: Diagnosis not present

## 2023-11-20 DIAGNOSIS — M7150 Other bursitis, not elsewhere classified, unspecified site: Secondary | ICD-10-CM | POA: Diagnosis not present

## 2023-11-21 ENCOUNTER — Ambulatory Visit: Payer: Self-pay | Admitting: Internal Medicine

## 2023-11-21 NOTE — Telephone Encounter (Signed)
  Chief Complaint: neck twitching & right elbow swollen Symptoms: neck twitching for 72yrs or more 7 new onset of right elbow swelling, redness, - pt states size of grapefruit Frequency: elbow swelling swelling x 2 days  Pertinent Negatives: Patient denies drainage from elbow or pus Disposition: [] ED /[] Urgent Care (no appt availability in office) / [] Appointment(In office/virtual)/ []  [facility]/ [] Home Care/ [] Refused Recommended Disposition /[] [facility]/ [x]  Follow-up with PCP Additional Notes: pt c/o neck jerking so much that pain begins in neck. [facility] to take for elbow. Stated yesterday size of golf ball & today size of grapefruit. Pt would like to be seen by PCP but no available appt w/n next couple of weeks.  Will route info to PCP to request pt to be worked into schedule and office to call back  Reason for Disposition  Muscle jerks, tics, or shudders are a chronic symptom (recurrent or ongoing AND present > 4 weeks)  Answer Assessment - Initial Assessment Questions 1. APPEARANCE of MOVEMENT: What did the jerking or twitching look like? (e.g., body area)     Jerking neck muscle 2. ONSET: When did this start happening? (e.g., hours, days, weeks, months ago) Neck muscle twitching 2 years or more - after it does it for a long time it starts to hurt   right elbow pain and soreness x 2 days 3. DURATION: How long does the jerk, twitch, or spasm last?     Comes and goes 4. FREQUENCY:  How often does this happen?      ongoing 5. WHEN: When does this happen? (e.g., while awake, while falling asleep, while sleeping)     On and off throughout the day 6. CAUSE: What do you think caused the unknown?     Right elbow - [facility] & [facility] - has fluid swollen grapefruit size , hot to touch 7. OTHER SYMPTOMS: Are there any other symptoms? (e.g., fever, headache)      8. PREGNANCY: Is there any chance you are pregnant?  When was your last menstrual period?     N/a  Protocols used: Muscle Jerks - Tics - [facility]

## 2023-11-21 NOTE — Telephone Encounter (Signed)
 Called patient back: no answer: left voicemail message for patient to return call: will attempt to call back again.

## 2023-11-21 NOTE — Telephone Encounter (Signed)
 Lvm to schedule appt.

## 2023-11-21 NOTE — Telephone Encounter (Signed)
 Copied from CRM [id number]. Topic: Clinical - Red Word Triage >> Nov 21, 2023 10:21 AM [name] wrote: Reason for CRM: The patient is wanting to schedule an acute visit with PCP for a muscle twitch in his neck and fluid on his elbow. No open availability shown for next week, the soonest I can schedule him with Dr. Tobie would be 02/08 in [employer]. He states the muscle twitch is not causing any pain but he would like to discuss further with Dr. Tobie only. The patient has fluid on his elbow that is not causing him pain but he does not like the appearance, he stated this is from an accidental injury that happened last week. I asked the patient if he would like to speak with one of our triage nurses concerning his symptoms and he declined.  Callback #: [phone]

## 2023-11-22 ENCOUNTER — Telehealth: Payer: Medicare HMO | Admitting: Internal Medicine

## 2023-11-22 ENCOUNTER — Ambulatory Visit (INDEPENDENT_AMBULATORY_CARE_PROVIDER_SITE_OTHER): Payer: Medicare HMO | Admitting: Orthopaedic Surgery

## 2023-11-22 ENCOUNTER — Other Ambulatory Visit: Payer: Self-pay

## 2023-11-22 ENCOUNTER — Encounter: Payer: Self-pay | Admitting: Internal Medicine

## 2023-11-22 ENCOUNTER — Encounter: Payer: Self-pay | Admitting: Orthopaedic Surgery

## 2023-11-22 VITALS — Ht 74.0 in

## 2023-11-22 DIAGNOSIS — M7031 Other bursitis of elbow, right elbow: Secondary | ICD-10-CM

## 2023-11-22 DIAGNOSIS — G243 Spasmodic torticollis: Secondary | ICD-10-CM

## 2023-11-22 DIAGNOSIS — M25521 Pain in right elbow: Secondary | ICD-10-CM | POA: Diagnosis not present

## 2023-11-22 DIAGNOSIS — R259 Unspecified abnormal involuntary movements: Secondary | ICD-10-CM | POA: Insufficient documentation

## 2023-11-22 DIAGNOSIS — S91312D Laceration without foreign body, left foot, subsequent encounter: Secondary | ICD-10-CM

## 2023-11-22 DIAGNOSIS — M7021 Olecranon bursitis, right elbow: Secondary | ICD-10-CM

## 2023-11-22 DIAGNOSIS — M79642 Pain in left hand: Secondary | ICD-10-CM

## 2023-11-22 DIAGNOSIS — S91312A Laceration without foreign body, left foot, initial encounter: Secondary | ICD-10-CM | POA: Insufficient documentation

## 2023-11-22 NOTE — Assessment & Plan Note (Signed)
 Right elbow swelling likely due to bursitis Advised to apply cool compresses Would avoid oral NSAIDs due to CKD Follow up with orthopedic surgery
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
## 2023-11-22 NOTE — Assessment & Plan Note (Signed)
 Has chronic neck twitching symptoms, but due to recent involuntary neck muscle contractures leading to neck soreness - concerning for cervical dystonia Referred to neurology for considering Botox injections

## 2023-11-22 NOTE — Assessment & Plan Note (Addendum)
 Has completed amoxicillin prescribed by urgent care Continue local wound care - apply mupirocin cream for bacterial PPx Keep area clean and dry If wound does not heal in 2 weeks, will refer to wound care clinic

## 2023-11-22 NOTE — Patient Instructions (Signed)
 Please continue taking Amoxicillin as prescribed.  Please apply cool compresses/ice over right elbow area. Please follow up with Orthopedic surgery as scheduled.  You are being referred to Neurology for evaluation of neck muscle contractions.
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
## 2023-11-23 DIAGNOSIS — M7021 Olecranon bursitis, right elbow: Secondary | ICD-10-CM

## 2023-11-23 DIAGNOSIS — M25521 Pain in right elbow: Secondary | ICD-10-CM | POA: Diagnosis not present

## 2023-11-23 DIAGNOSIS — M79642 Pain in left hand: Secondary | ICD-10-CM | POA: Diagnosis not present

## 2023-11-23 MED ORDER — LIDOCAINE HCL 1 % IJ SOLN
1.0000 mL | INTRAMUSCULAR | Status: AC | PRN
  Administered 2023-11-23: 1 mL

## 2023-11-26 ENCOUNTER — Telehealth: Payer: Self-pay

## 2023-11-26 NOTE — Telephone Encounter (Signed)
 Spoke to pt states he went by [facility] neurology and they stated they needed additional notes from [place] to see about getting him seen, they said they would reach back at to him.
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
## 2023-11-26 NOTE — Telephone Encounter (Signed)
 Copied from CRM [id number]. Topic: Referral - Request for Referral >> Nov 26, 2023  9:48 AM Rodell CROME wrote: Did the patient discuss referral with their provider in the last year? Yes (If No - schedule appointment) (If Yes - send message)  Appointment offered? No  Type of order/referral and detailed reason for visit: neurologist  Preference of office, provider, location: [facility] in [place]  If referral order, have you been seen by this specialty before? No (If Yes, this issue or another issue? When? Where?  Can we respond through MyChart? Yes

## 2023-11-27 ENCOUNTER — Other Ambulatory Visit: Payer: Self-pay | Admitting: Internal Medicine

## 2023-11-27 ENCOUNTER — Ambulatory Visit (INDEPENDENT_AMBULATORY_CARE_PROVIDER_SITE_OTHER): Payer: Medicare HMO | Admitting: Neurology

## 2023-11-27 ENCOUNTER — Telehealth: Payer: Self-pay | Admitting: Internal Medicine

## 2023-11-27 ENCOUNTER — Encounter: Payer: Self-pay | Admitting: Neurology

## 2023-11-27 VITALS — BP 122/80 | HR 73 | Ht 74.0 in | Wt 264.6 lb

## 2023-11-27 DIAGNOSIS — R259 Unspecified abnormal involuntary movements: Secondary | ICD-10-CM | POA: Diagnosis not present

## 2023-11-27 DIAGNOSIS — E1140 Type 2 diabetes mellitus with diabetic neuropathy, unspecified: Secondary | ICD-10-CM

## 2023-11-27 MED ORDER — OXYCODONE-ACETAMINOPHEN 5-325 MG PO TABS
1.0000 | ORAL_TABLET | Freq: Three times a day (TID) | ORAL | 0 refills | Status: DC | PRN
Start: 2023-11-27 — End: 2024-01-11

## 2023-11-27 NOTE — Telephone Encounter (Signed)
 Left detailed vm

## 2023-11-27 NOTE — Patient Instructions (Signed)
 It was good to see you today!  I didn't see any evidence of cervical dystonia.  The physicians and staff at [facility] are committed to providing excellent care. You may receive a survey requesting feedback about your experience at our office. We strive to receive very good responses to the survey questions. If you feel that your experience would prevent you from giving the office a very good  response, please contact our office to try to remedy the situation. We may be reached at [phone]. Thank you for taking the time out of your busy day to complete the survey.

## 2023-11-27 NOTE — Telephone Encounter (Signed)
 Patient need med refills:   Need refill only has 2 pills left  oxyCODONE-acetaminophen (PERCOCET/ROXICET) 5-325 MG tablet [[id number]]  Pharmacy: [facility]

## 2023-11-27 NOTE — Telephone Encounter (Signed)
 Patient came into the office said that   Tat, Octaviano Batty, DO  Says nothing she can do for him .  Said the provider will send a message to Dr Allena Katz.  Concern with kidney disease concern on the lyrica saying this could be causing issues in the neck.

## 2023-12-01 ENCOUNTER — Other Ambulatory Visit: Payer: Self-pay | Admitting: Internal Medicine

## 2023-12-01 DIAGNOSIS — G4700 Insomnia, unspecified: Secondary | ICD-10-CM

## 2023-12-03 ENCOUNTER — Telehealth: Payer: Self-pay | Admitting: Internal Medicine

## 2023-12-03 ENCOUNTER — Ambulatory Visit: Payer: Self-pay | Admitting: Internal Medicine

## 2023-12-03 ENCOUNTER — Other Ambulatory Visit: Payer: Self-pay | Admitting: Internal Medicine

## 2023-12-03 DIAGNOSIS — G4700 Insomnia, unspecified: Secondary | ICD-10-CM

## 2023-12-03 MED ORDER — TRAZODONE HCL 150 MG PO TABS: 225.0000 mg | ORAL_TABLET | Freq: Every evening | ORAL | 1 refills | Status: DC | PRN

## 2023-12-03 NOTE — Telephone Encounter (Signed)
Copied from CRM [id number]. Topic: Clinical - Medication Refill >> Dec 03, 2023  9:19 AM Lorretta Harp wrote: Most Recent Primary Care Visit:  Provider: Anabel Halon  Department: [facility]  Visit Type: MYCHART VIDEO VISIT  Date: 11/22/2023  Medication: traZODone (DESYREL) 150 MG tablet   Has the patient contacted their pharmacy? Yes (Agent: If no, request that the patient contact the pharmacy for the refill. If patient does not wish to contact the pharmacy document the reason why and proceed with request.) (Agent: If yes, when and what did the pharmacy advise?)  Is this the correct pharmacy for this prescription? Yes If no, delete pharmacy and type the correct one.  This is the patient's preferred pharmacy:  [id number] [place], [place] - [address] [place] [address] Phone: [phone] Fax: [phone]    Has the prescription been filled recently? No  Is the patient out of the medication? Yes  Has the patient been seen for an appointment in the last year OR does the patient have an upcoming appointment? Yes  Can we respond through MyChart? No  Agent: Please be advised that Rx refills may take up to 3 business days. We ask that you follow-up with your pharmacy.

## 2023-12-03 NOTE — Telephone Encounter (Signed)
Duplicate request, see 12/01/23 encounter addressed by Dr Trena Platt.

## 2023-12-03 NOTE — Telephone Encounter (Signed)
Copied from CRM [id number]. Topic: Clinical - Medication Refill >> Dec 03, 2023  9:19 AM Lorretta Harp wrote: Most Recent Primary Care Visit:  Provider: Anabel Halon  Department: [facility]  Visit Type: MYCHART VIDEO VISIT  Date: 11/22/2023  Medication: traZODone (DESYREL) 150 MG tablet  Has the patient contacted their pharmacy? Yes (Agent: If no, request that the patient contact the pharmacy for the refill. If patient does not wish to contact the pharmacy document the reason why and proceed with request.) (Agent: If yes, when and what did the pharmacy advise?)  Is this the correct pharmacy for this prescription? Yes If no, delete pharmacy and type the correct one.  This is the patient's preferred pharmacy:  [id number] [place], [place] - [address] [place] [address] Phone: [phone] Fax: [phone]    Has the prescription been filled recently? No  Is the patient out of the medication? Yes  Has the patient been seen for an appointment in the last year OR does the patient have an upcoming appointment? Yes  Can we respond through MyChart? no  Agent: Please be advised that Rx refills may take up to 3 business days. We ask that you follow-up with your pharmacy.

## 2023-12-03 NOTE — Telephone Encounter (Signed)
Patient came by office needing refills on traZODone (DESYREL) 150 MG tablet [[id number]]  States that he has spoke with provider about increasing quantity of medication  Wants a call  back in regard

## 2023-12-03 NOTE — Telephone Encounter (Signed)
Patient called requesting medication refill on his Trazodone. Patient states that he had increased his dose because "it wasn't working right." Patient had spoken to his pharmacy and pharmacist stated patient needs a new prescription called in for this medication at his proper dose. All questions answered.     Copied from CRM [id number]. Topic: Clinical - Prescription Issue >> Dec 03, 2023  4:13 PM Ivette P wrote: Reason for CRM: Pt needs medication refill and is at the pharmacy Reason for Disposition  [1] Other NON-URGENT information for PCP AND [2] does not require PCP response  Answer Assessment - Initial Assessment Questions 1. REASON FOR CALL or QUESTION: "What is your reason for calling today?" or "How can I best help you?" or "What question do you have that I can help answer?"     Patient was calling about a prescription for trazodone. Patient states that he had to increase his dose and had spoken to Dr. Allena Katz about this at his previous visit. 2. CALLER: Document the source of call. (e.g., laboratory, patient).     Patient  Protocols used: PCP Call - No Triage-A-AH

## 2023-12-04 NOTE — Telephone Encounter (Signed)
Pt informed rest of rx was sent in.

## 2023-12-13 ENCOUNTER — Encounter: Payer: Self-pay | Admitting: Internal Medicine

## 2023-12-13 ENCOUNTER — Ambulatory Visit: Payer: Medicare HMO | Admitting: Internal Medicine

## 2023-12-13 VITALS — BP 121/78 | HR 75 | Ht 74.0 in | Wt 258.8 lb

## 2023-12-13 DIAGNOSIS — R259 Unspecified abnormal involuntary movements: Secondary | ICD-10-CM | POA: Diagnosis not present

## 2023-12-13 DIAGNOSIS — K649 Unspecified hemorrhoids: Secondary | ICD-10-CM | POA: Diagnosis not present

## 2023-12-13 DIAGNOSIS — K5904 Chronic idiopathic constipation: Secondary | ICD-10-CM

## 2023-12-13 DIAGNOSIS — Z7984 Long term (current) use of oral hypoglycemic drugs: Secondary | ICD-10-CM

## 2023-12-13 DIAGNOSIS — M7021 Olecranon bursitis, right elbow: Secondary | ICD-10-CM

## 2023-12-13 DIAGNOSIS — E1140 Type 2 diabetes mellitus with diabetic neuropathy, unspecified: Secondary | ICD-10-CM

## 2023-12-13 MED ORDER — LINACLOTIDE 145 MCG PO CAPS: 145.0000 ug | ORAL_CAPSULE | Freq: Every day | ORAL | 5 refills | Status: DC

## 2023-12-13 NOTE — Patient Instructions (Addendum)
Please start taking Lyrica once at bedtime for 1 week and then stop.  Please start taking Linzess every other day for now.  Please follow up with Dr Cliffton Asters for hemorrhoids.  Please apply ice over right elbow. If persistent swelling after 2 days, please contact Orthopedic surgery.

## 2023-12-13 NOTE — Assessment & Plan Note (Addendum)
Lab Results  Component Value Date   HGBA1C 5.9 (H) 10/18/2023   Well-controlled On Jardiance 25 mg QD On Mounjaro 10 mg qw Advised to follow diabetic diet On statin and ARB F/u CMP and lipid panel Diabetic eye exam: Advised to follow up with Ophthalmology for diabetic eye exam  On Lyrica to 50 mg BID for DM neuropathy - due to neck movements, will taper and discontinue Lyrica Percocet PRN for severe pain

## 2023-12-13 NOTE — Assessment & Plan Note (Signed)
Rectal soreness, worse with chronic constipation Could be due to internal hemorrhoids Anusol suppository as needed Referred to GI - had banding, but has recurrent of pain Referred to General surgeon Advised to take Linzess regularly for constipation

## 2023-12-13 NOTE — Assessment & Plan Note (Signed)
Has chronic neck twitching symptoms, but due to recent involuntary neck muscle contractures leading to neck soreness - had referred to neurology Unclear etiology Discontinue Lyrica as it has some incidence of myoclonus

## 2023-12-13 NOTE — Assessment & Plan Note (Signed)
Has tried Colace, senna, MiraLAX Have discontinued gabapentin and Elavil in the past that was worsening constipation Takes Linzess PRN, advised to take it regularly Maintain adequate hydration Prune juice as needed
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
## 2023-12-13 NOTE — Assessment & Plan Note (Signed)
Right elbow swelling likely due to bursitis Advised to apply cool compresses Would avoid oral NSAIDs due to CKD Follow up with orthopedic surgery

## 2023-12-24 ENCOUNTER — Other Ambulatory Visit: Payer: Self-pay | Admitting: Internal Medicine

## 2023-12-24 ENCOUNTER — Ambulatory Visit: Payer: Medicare HMO | Admitting: Family Medicine

## 2023-12-24 ENCOUNTER — Ambulatory Visit: Payer: Self-pay | Admitting: Internal Medicine

## 2023-12-24 DIAGNOSIS — E1140 Type 2 diabetes mellitus with diabetic neuropathy, unspecified: Secondary | ICD-10-CM

## 2023-12-24 MED ORDER — DULOXETINE HCL 30 MG PO CPEP: 30.0000 mg | ORAL_CAPSULE | Freq: Every day | ORAL | 0 refills | Status: DC

## 2023-12-24 NOTE — Telephone Encounter (Signed)
 Pt had appt with Dr. Debrah Fan for today, pt cancelled the appt.

## 2023-12-24 NOTE — Telephone Encounter (Signed)
  Chief Complaint: Bilateral feet pain Symptoms: Pain Disposition: [] ED /[] Urgent Care (no appt availability in office) / [x] Appointment(In office/virtual)/ []  [facility]/ [] Home Care/ [] Refused Recommended Disposition /[]  Mobile Bus/ []  Follow-up with PCP Additional Notes: Pt has pain in bilateral feet. Pt has had neck muscle twitching x2 years. Pt was seen by a specialist and was told to stop taking pregabalin . Has not taken in 11 days. Pt states the pain in his feet is the same. Pt was given a rx for prn pain meds- oxycodone . Pt states he has not had a bad day where he feels like he needs to take one. Pt is wondering if he needs to take another medication besides pregabalin  as his pain is still there. An appt was made for today. This RN educated pt on home care, new-worsening symptoms, when to call back/seek emergent care. Pt verbalized understanding and agrees to plan.     Copied from CRM [id number]. Topic: Clinical - Red Word Triage >> Dec 24, 2023 10:07 AM Elle L wrote: Red Word that prompted transfer to Nurse Triage: The patient was seen in the office on 1/30 for muscle twitching in neck. He was advised to stop taking Pregabalin  to see if the muscle twitches stop and it did. However, he is still in the same amount of pain. Reason for Disposition  [1] MODERATE pain (e.g., interferes with normal activities, limping) AND [2] present > 3 days  Answer Assessment - Initial Assessment Questions 1. ONSET: "When did the pain start?"      2 years ago 2. LOCATION: "Where is the pain located?"      Feet 3. PAIN: "How bad is the pain?"    (Scale 1-10; or mild, moderate, severe)  - MILD (1-3): doesn't interfere with normal activities.   - MODERATE (4-7): interferes with normal activities (e.g., work or school) or awakens from sleep, limping.   - SEVERE (8-10): excruciating pain, unable to do any normal activities, unable to walk.      5  Protocols used: Foot Pain-A-AH

## 2024-01-03 ENCOUNTER — Telehealth: Payer: Self-pay | Admitting: Internal Medicine

## 2024-01-03 NOTE — Telephone Encounter (Unsigned)
Copied from CRM [id number]. Topic: General - Other >> Jan 03, 2024  1:05 PM [name] wrote: Reason for CRM: was taking DULoxetine (CYMBALTA) 30 MG capsule  For 2 weeks it is a depression medication, was very weak and nauseas, since he has stopped taking the medication he has more energy and no nausea with it he can't function because of it the medication is for nerve pain and the pain is really bad, so needs some other medication to help with pain

## 2024-01-03 NOTE — Telephone Encounter (Signed)
Tried calling pt/ unable to leave a message

## 2024-01-07 DIAGNOSIS — N189 Chronic kidney disease, unspecified: Secondary | ICD-10-CM | POA: Diagnosis not present

## 2024-01-07 DIAGNOSIS — E1122 Type 2 diabetes mellitus with diabetic chronic kidney disease: Secondary | ICD-10-CM | POA: Diagnosis not present

## 2024-01-07 DIAGNOSIS — N200 Calculus of kidney: Secondary | ICD-10-CM | POA: Diagnosis not present

## 2024-01-07 DIAGNOSIS — D751 Secondary polycythemia: Secondary | ICD-10-CM | POA: Diagnosis not present

## 2024-01-07 DIAGNOSIS — R809 Proteinuria, unspecified: Secondary | ICD-10-CM | POA: Diagnosis not present

## 2024-01-07 DIAGNOSIS — E1129 Type 2 diabetes mellitus with other diabetic kidney complication: Secondary | ICD-10-CM | POA: Diagnosis not present

## 2024-01-07 DIAGNOSIS — Z6834 Body mass index (BMI) 34.0-34.9, adult: Secondary | ICD-10-CM | POA: Diagnosis not present

## 2024-01-11 ENCOUNTER — Other Ambulatory Visit: Payer: Self-pay | Admitting: Internal Medicine

## 2024-01-11 DIAGNOSIS — E1140 Type 2 diabetes mellitus with diabetic neuropathy, unspecified: Secondary | ICD-10-CM

## 2024-01-11 NOTE — Telephone Encounter (Signed)
 Last Fill: 11/27/23 20 tabs/0 RF  Last OV: 12/13/23 Next OV: 01/16/24  Routing to provider for review/authorization.;

## 2024-01-11 NOTE — Telephone Encounter (Signed)
 Patient calling again for refill on oxyCODONE-acetaminophen (PERCOCET/ROXICET) 5-325 MG tablet [[id number]]

## 2024-01-11 NOTE — Telephone Encounter (Signed)
 Copied from CRM [id number]. Topic: Clinical - Medication Refill >> Jan 11, 2024  2:10 PM Fredrica W wrote: Most Recent Primary Care Visit:  Provider: Anabel Halon  Department: [facility]  Visit Type: OFFICE VISIT  Date: 12/13/2023  Medication: oxyCODONE-acetaminophen (PERCOCET/ROXICET) 5-325 MG  Has the patient contacted their pharmacy? Yes (Agent: If no, request that the patient contact the pharmacy for the refill. If patient does not wish to contact the pharmacy document the reason why and proceed with request.) (Agent: If yes, when and what did the pharmacy advise?) closed  Is this the correct pharmacy for this prescription? Yes If no, delete pharmacy and type the correct one.  This is the patient's preferred pharmacy:  [id number] - [place], [place] - [address] [place] [address] Phone: [phone] Fax: [phone]  Has the prescription been filled recently? N/A  Is the patient out of the medication? 1 left  Has the patient been seen for an appointment in the last year OR does the patient have an upcoming appointment? Yes  Can we respond through MyChart? Yes  Agent: Please be advised that Rx refills may take up to 3 business days. We ask that you follow-up with your pharmacy.

## 2024-01-14 NOTE — Telephone Encounter (Signed)
 Patient calling again for refill on oxyCODONE-acetaminophen (PERCOCET/ROXICET) 5-325 MG tablet [[id number]]

## 2024-01-15 ENCOUNTER — Other Ambulatory Visit: Payer: Self-pay | Admitting: Internal Medicine

## 2024-01-15 DIAGNOSIS — E1140 Type 2 diabetes mellitus with diabetic neuropathy, unspecified: Secondary | ICD-10-CM

## 2024-01-15 MED ORDER — OXYCODONE-ACETAMINOPHEN 5-325 MG PO TABS
1.0000 | ORAL_TABLET | Freq: Three times a day (TID) | ORAL | 0 refills | Status: DC | PRN
Start: 2024-01-15 — End: 2024-02-25

## 2024-01-16 ENCOUNTER — Encounter: Payer: Self-pay | Admitting: Internal Medicine

## 2024-01-16 ENCOUNTER — Ambulatory Visit (INDEPENDENT_AMBULATORY_CARE_PROVIDER_SITE_OTHER): Payer: Medicare HMO | Admitting: Internal Medicine

## 2024-01-16 VITALS — BP 115/77 | HR 69 | Ht 74.0 in | Wt 250.6 lb

## 2024-01-16 DIAGNOSIS — E1140 Type 2 diabetes mellitus with diabetic neuropathy, unspecified: Secondary | ICD-10-CM

## 2024-01-16 DIAGNOSIS — K5904 Chronic idiopathic constipation: Secondary | ICD-10-CM | POA: Diagnosis not present

## 2024-01-16 DIAGNOSIS — Z7984 Long term (current) use of oral hypoglycemic drugs: Secondary | ICD-10-CM | POA: Diagnosis not present

## 2024-01-16 DIAGNOSIS — N1831 Chronic kidney disease, stage 3a: Secondary | ICD-10-CM | POA: Diagnosis not present

## 2024-01-16 DIAGNOSIS — E782 Mixed hyperlipidemia: Secondary | ICD-10-CM | POA: Diagnosis not present

## 2024-01-16 DIAGNOSIS — I10 Essential (primary) hypertension: Secondary | ICD-10-CM | POA: Diagnosis not present

## 2024-01-16 DIAGNOSIS — M7021 Olecranon bursitis, right elbow: Secondary | ICD-10-CM

## 2024-01-16 MED ORDER — TIRZEPATIDE 12.5 MG/0.5ML ~~LOC~~ SOAJ: 12.5000 mg | SUBCUTANEOUS | 1 refills | Status: DC

## 2024-01-16 MED ORDER — PREGABALIN 25 MG PO CAPS: 25.0000 mg | ORAL_CAPSULE | Freq: Two times a day (BID) | ORAL | 3 refills | Status: DC

## 2024-01-16 NOTE — Assessment & Plan Note (Addendum)
 Lab Results  Component Value Date   HGBA1C 5.9 (H) 10/18/2023   Well-controlled On Jardiance 25 mg QD On Mounjaro 10 mg qw - tolerating well, prefers to increase dose, increased dose to 12.5 mg for weight loss benefit as well - no episode of hypoglycemia Advised to follow diabetic diet On statin and ARB F/u CMP and lipid panel Diabetic eye exam: Advised to follow up with Ophthalmology for diabetic eye exam  Did not tolerate Cymbalta Started Lyrica lower dose - 25 mg BID Percocet PRN for severe pain

## 2024-01-16 NOTE — Assessment & Plan Note (Signed)
Likely due to underlying DM and HTN Followed by Nephrology Avoid nephrotoxic agents On Losartan and Jardiance 

## 2024-01-16 NOTE — Assessment & Plan Note (Signed)
 Has tried Colace, senna, MiraLAX Have discontinued gabapentin and Elavil in the past that was worsening constipation Takes Linzess - improved now Maintain adequate hydration Prune juice as needed

## 2024-01-16 NOTE — Patient Instructions (Signed)
 Please start taking Mounjaro 12.5 mg after completing Mounjaro 10 mg.  Please start taking Lyrica for neuropathy.  Please continue to take medications as prescribed.  Please continue to follow low carb diet and perform moderate exercise/walking at least 150 mins/week.
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
## 2024-01-16 NOTE — Assessment & Plan Note (Signed)
 BP Readings from Last 1 Encounters:  01/16/24 115/77   Well controlled with losartan 50 mg QD Counseled for compliance with the medications Advised DASH diet and moderate exercise/walking, at least 150 mins/week

## 2024-01-17 ENCOUNTER — Encounter: Payer: Self-pay | Admitting: Orthopaedic Surgery

## 2024-01-17 ENCOUNTER — Ambulatory Visit: Admitting: Orthopaedic Surgery

## 2024-01-17 VITALS — Ht 74.0 in | Wt 250.0 lb

## 2024-01-17 DIAGNOSIS — M7021 Olecranon bursitis, right elbow: Secondary | ICD-10-CM | POA: Diagnosis not present

## 2024-01-17 MED ORDER — LIDOCAINE HCL 1 % IJ SOLN
0.5000 mL | INTRAMUSCULAR | Status: AC | PRN
  Administered 2024-01-17: .5 mL
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
## 2024-01-18 NOTE — Assessment & Plan Note (Signed)
 Right elbow swelling likely due to bursitis Advised to apply cool compresses Would avoid oral NSAIDs due to CKD Follow up with orthopedic surgery

## 2024-01-18 NOTE — Assessment & Plan Note (Addendum)
On Crestor Check lipid profile 

## 2024-01-25 ENCOUNTER — Telehealth: Payer: Self-pay | Admitting: Internal Medicine

## 2024-01-25 DIAGNOSIS — S91301A Unspecified open wound, right foot, initial encounter: Secondary | ICD-10-CM | POA: Diagnosis not present

## 2024-01-25 NOTE — Telephone Encounter (Signed)
 Copied from CRM [id number]. Topic: General - Other >> Jan 25, 2024  2:03 PM Emylou G wrote: Reason for CRM: please contact patient.. is it okay to take Biotin .Marland Kitchen His number [phone]

## 2024-01-28 ENCOUNTER — Telehealth: Payer: Self-pay | Admitting: Internal Medicine

## 2024-01-28 ENCOUNTER — Ambulatory Visit: Payer: Self-pay | Admitting: Internal Medicine

## 2024-01-28 NOTE — Telephone Encounter (Signed)
 Spoke to pt , let him know per Allena Katz it is Fine.

## 2024-01-28 NOTE — Telephone Encounter (Signed)
 Left detailed vm

## 2024-01-28 NOTE — Telephone Encounter (Unsigned)
 Copied from CRM [id number]. Topic: General - Other >> Jan 25, 2024  2:03 PM Emylou G wrote: Reason for CRM: please contact patient.. is it okay to take Biotin .Marland Kitchen His number [phone] >> Jan 25, 2024  5:31 PM Victorino Dike T wrote: Patient called again, did not get a call back

## 2024-01-28 NOTE — Telephone Encounter (Signed)
 Copied from CRM [id number]. Topic: Clinical - Red Word Triage >> Jan 28, 2024  9:27 AM Elle L wrote: Red Word that prompted transfer to Nurse Triage: The patient is a diabetic and has a painful wound on his foot. It is red and raised and looks like a volcano. He states he has had it for two weeks and it is not healing.   Chief Complaint: wound Symptoms: redness and pain Frequency: ongoing 2 weeks Pertinent Negatives: Patient denies fever Disposition: [] ED /[] Urgent Care (no appt availability in office) / [x] Appointment(In office/virtual)/ []  [facility]/ [] Home Care/ [] Refused Recommended Disposition /[] [facility]/ []  Follow-up with PCP Additional Notes:  The patient has a history of diabetes and what started as a small cut or puncture on the side of his right foot is now as large as a quarter and has not healed in 2 weeks.  He has neuropathy and arthritis in his feet and is unsure how he got the injury.   He was seen at urgent care and prescribed an antibiotic that "starts with a d" and has been taking it for 4 days.  He did not have access to the prescription to provide full name.  He described the appearance of the wound as "like a volcano." It's hard around the perimeter and in the center it is soft and red and very sore.  He was scheduled for a next day appointment for further evaluation.  Reason for Disposition  [1] Looks infected (spreading redness, pus) AND [2] no fever  Answer Assessment - Initial Assessment Questions 1. APPEARANCE of INJURY: "What does the injury look like?"      Looks like a volcano - hard around it like a ring raised up high and hard around it and in the middle soft; very sore 2. SIZE: "How large is the cut?"      quarter 3. BLEEDING: "Is it bleeding now?" If Yes, ask: "Is it difficult to stop?"      None  4. LOCATION: "Where is the injury located?"      Right foot on the side  5. ONSET: "How long ago did the injury occur?"      Small red  cut or puncture  6. MECHANISM: "Tell me how it happened."      Unsure  Protocols used: Skin Injury-A-AH

## 2024-01-29 ENCOUNTER — Ambulatory Visit (INDEPENDENT_AMBULATORY_CARE_PROVIDER_SITE_OTHER): Admitting: Internal Medicine

## 2024-01-29 ENCOUNTER — Encounter: Payer: Self-pay | Admitting: Internal Medicine

## 2024-01-29 VITALS — BP 120/74 | HR 85 | Ht 74.0 in | Wt 247.6 lb

## 2024-01-29 DIAGNOSIS — L03119 Cellulitis of unspecified part of limb: Secondary | ICD-10-CM | POA: Diagnosis not present

## 2024-01-29 DIAGNOSIS — M21611 Bunion of right foot: Secondary | ICD-10-CM | POA: Diagnosis not present
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
## 2024-01-29 NOTE — Assessment & Plan Note (Signed)
 Has bunions on b/l feet Inflamed bunion can also cause similar pain and swelling Keep area covered for now

## 2024-01-29 NOTE — Assessment & Plan Note (Signed)
 Likely due to constant friction from either a new shoe or recent injury Appears to be healing properly currently Advised to complete doxycycline Can keep it covered to prevent constant friction/abrasion Can soak in warm water If worsening pain or not healing properly, will refer to Podiatry

## 2024-01-29 NOTE — Patient Instructions (Addendum)
 Please complete Doxycycline.  Please soak foot in warm water.  Okay to apply Mupirocin ointment over the edges of the wound.  If you notice worsening of the pain, please contact us to be referred to Podiatry.

## 2024-01-30 ENCOUNTER — Encounter: Payer: Self-pay | Admitting: Orthopaedic Surgery

## 2024-01-30 ENCOUNTER — Ambulatory Visit (INDEPENDENT_AMBULATORY_CARE_PROVIDER_SITE_OTHER): Admitting: Orthopaedic Surgery

## 2024-01-30 VITALS — Ht 74.0 in | Wt 247.0 lb

## 2024-01-30 DIAGNOSIS — M7021 Olecranon bursitis, right elbow: Secondary | ICD-10-CM | POA: Diagnosis not present

## 2024-01-30 MED ORDER — LIDOCAINE HCL 1 % IJ SOLN
0.5000 mL | INTRAMUSCULAR | Status: AC | PRN
  Administered 2024-01-30: .5 mL
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
## 2024-02-13 ENCOUNTER — Telehealth: Payer: Self-pay | Admitting: Internal Medicine

## 2024-02-13 DIAGNOSIS — E1129 Type 2 diabetes mellitus with other diabetic kidney complication: Secondary | ICD-10-CM | POA: Diagnosis not present

## 2024-02-13 DIAGNOSIS — N1831 Chronic kidney disease, stage 3a: Secondary | ICD-10-CM | POA: Diagnosis not present

## 2024-02-13 DIAGNOSIS — R809 Proteinuria, unspecified: Secondary | ICD-10-CM | POA: Diagnosis not present

## 2024-02-13 DIAGNOSIS — E1122 Type 2 diabetes mellitus with diabetic chronic kidney disease: Secondary | ICD-10-CM | POA: Diagnosis not present

## 2024-02-13 NOTE — Telephone Encounter (Signed)
 Patient came by office the sleeping medicine he is currently on not helping at all. He has looked on line for another medicine it is called Suvorexant - Blsomrama (stops the brain from release chemical that stops from waking up in the morning per patient.)  Call patient at [phone]

## 2024-02-14 ENCOUNTER — Other Ambulatory Visit: Payer: Self-pay | Admitting: Internal Medicine

## 2024-02-14 DIAGNOSIS — N522 Drug-induced erectile dysfunction: Secondary | ICD-10-CM

## 2024-02-14 NOTE — Telephone Encounter (Signed)
 Spoke to pt, verbalized understanding

## 2024-02-14 NOTE — Telephone Encounter (Signed)
 Patient called to check status of refill request. Refill request submitted by pharmacy. Patient states there has been some miscommunication and he want to make sure request is sent to provider today. Added note to request. Thank You

## 2024-02-25 ENCOUNTER — Other Ambulatory Visit: Payer: Self-pay | Admitting: Internal Medicine

## 2024-02-25 DIAGNOSIS — G4700 Insomnia, unspecified: Secondary | ICD-10-CM

## 2024-02-25 DIAGNOSIS — E1140 Type 2 diabetes mellitus with diabetic neuropathy, unspecified: Secondary | ICD-10-CM

## 2024-02-25 DIAGNOSIS — K5904 Chronic idiopathic constipation: Secondary | ICD-10-CM

## 2024-02-25 MED ORDER — OXYCODONE-ACETAMINOPHEN 5-325 MG PO TABS
1.0000 | ORAL_TABLET | Freq: Three times a day (TID) | ORAL | 0 refills | Status: DC | PRN
Start: 2024-02-25 — End: 2024-04-09

## 2024-02-25 MED ORDER — LINACLOTIDE 145 MCG PO CAPS
145.0000 ug | ORAL_CAPSULE | Freq: Every day | ORAL | 5 refills | Status: AC
Start: 2024-02-25 — End: ?

## 2024-02-25 MED ORDER — TRAZODONE HCL 150 MG PO TABS: 225.0000 mg | ORAL_TABLET | Freq: Every evening | ORAL | 1 refills | Status: DC | PRN

## 2024-02-25 NOTE — Telephone Encounter (Signed)
 Copied from CRM [id number]. Topic: Clinical - Medication Refill >> Feb 25, 2024  3:26 PM Lane Pinon A wrote: Most Recent Primary Care Visit:  Provider: Meldon Sport  Department: [facility]  Visit Type: ACUTE  Date: 01/29/2024  Medication: oxyCODONE-acetaminophen (PERCOCET/ROXICET) 5-325 MG tablet  traZODone (DESYREL) 150 MG tablet   Has the patient contacted their pharmacy? No Patient said he knows to call in for these medications to have them refilled.   Is this the correct pharmacy for this prescription? No If no, delete pharmacy and type the correct one.  This is the patient's preferred pharmacy:  [id number] [place], [place] - [address] [facility] [address] [facility] [address] [place] [address] Phone: [phone] Fax: [phone]  [facility] [address] [place] - [address] [place] [address] Phone: [phone] Fax: [phone]   Has the prescription been filled recently? No  Is the patient out of the medication? No  Has the patient been seen for an appointment in the last year OR does the patient have an upcoming appointment? Yes  Can we respond through MyChart? Yes  Agent: Please be advised that Rx refills may take up to 3 business days. We ask that you follow-up with your pharmacy.

## 2024-02-25 NOTE — Telephone Encounter (Signed)
 Copied from CRM [id number]. Topic: Clinical - Medication Refill >> Feb 25, 2024  3:31 PM Lane Pinon A wrote: Most Recent Primary Care Visit:  Provider: Meldon Sport  Department: [facility]  Visit Type: ACUTE  Date: 01/29/2024  Medication: Linzess   Has the patient contacted their pharmacy? No Patient says he knows to contact to refill this medication.   Is this the correct pharmacy for this prescription? Yes If no, delete pharmacy and type the correct one.  This is the patient's preferred pharmacy:  [id number] [place], [place] - [address] [facility] [address] [facility] [address] [place] [address] Phone: [phone] Fax: [phone]   Has the prescription been filled recently? No  Is the patient out of the medication? No  Has the patient been seen for an appointment in the last year OR does the patient have an upcoming appointment? Yes  Can we respond through MyChart? Yes  Agent: Please be advised that Rx refills may take up to 3 business days. We ask that you follow-up with your pharmacy.

## 2024-02-25 NOTE — Telephone Encounter (Signed)
 Copied from CRM [id number]. Topic: Clinical - Medication Refill >> Feb 25, 2024  3:21 PM Lane Pinon A wrote: Most Recent Primary Care Visit:  Provider: Meldon Sport  Department: [facility]  Visit Type: ACUTE  Date: 01/29/2024  Medication: oxyCODONE-acetaminophen (PERCOCET/ROXICET) 5-325 MG tablet  traZODone (DESYREL) 150 MG tablet   Has the patient contacted their pharmacy? No Patient says pain medication and trazodone have no refills   Is this the correct pharmacy for this prescription? Yes If no, delete pharmacy and type the correct one.  This is the patient's preferred pharmacy:  [id number] [place], [place] - [address] [facility] [address] [facility] [address] [place] [address] Phone: [phone] Fax: [phone]  [facility] [address] [place] - [address] [place] [address] Phone: [phone] Fax: [phone]   Has the prescription been filled recently? Yes 6 weeks ago for Oxy Trazadone a while ago   Is the patient out of the medication? No  Has the patient been seen for an appointment in the last year OR does the patient have an upcoming appointment? Yes  Can we respond through MyChart? Yes  Agent: Please be advised that Rx refills may take up to 3 business days. We ask that you follow-up with your pharmacy.

## 2024-02-25 NOTE — Telephone Encounter (Signed)
 Copied from CRM [id number]. Topic: Clinical - Medication Refill >> Feb 25, 2024  3:52 PM Lotus Round B wrote: Most Recent Primary Care Visit:  Provider: Meldon Sport  Department: [facility]  Visit Type: ACUTE  Date: 01/29/2024  Medication: oxyCODONE-acetaminophen (PERCOCET/ROXICET) 5-325 MG tablet  Has the patient contacted their pharmacy? Yes (Agent: If no, request that the patient contact the pharmacy for the refill. If patient does not wish to contact the pharmacy document the reason why and proceed with request.) (Agent: If yes, when and what did the pharmacy advise?)  Is this the correct pharmacy for this prescription? Yes If no, delete pharmacy and type the correct one.  This is the patient's preferred pharmacy:  [id number] [place], [place] - [address] [facility] [address] [facility] [address] [facility] [address] [place] [address] Phone: [phone] Fax: [phone]   Has the prescription been filled recently? Yes  Is the patient out of the medication? No  Has the patient been seen for an appointment in the last year OR does the patient have an upcoming appointment? Yes  Can we respond through MyChart? Yes  Agent: Please be advised that Rx refills may take up to 3 business days. We ask that you follow-up with your pharmacy.

## 2024-03-11 DIAGNOSIS — K6289 Other specified diseases of anus and rectum: Secondary | ICD-10-CM | POA: Diagnosis not present

## 2024-03-11 DIAGNOSIS — Z8719 Personal history of other diseases of the digestive system: Secondary | ICD-10-CM | POA: Diagnosis not present

## 2024-03-11 DIAGNOSIS — K648 Other hemorrhoids: Secondary | ICD-10-CM | POA: Diagnosis not present

## 2024-03-11 DIAGNOSIS — Z9889 Other specified postprocedural states: Secondary | ICD-10-CM | POA: Diagnosis not present

## 2024-03-21 ENCOUNTER — Telehealth: Payer: Self-pay | Admitting: Internal Medicine

## 2024-03-21 ENCOUNTER — Other Ambulatory Visit: Payer: Self-pay

## 2024-03-21 DIAGNOSIS — I10 Essential (primary) hypertension: Secondary | ICD-10-CM

## 2024-03-21 MED ORDER — LOSARTAN POTASSIUM 50 MG PO TABS: 50.0000 mg | ORAL_TABLET | Freq: Every day | ORAL | 0 refills | Status: DC

## 2024-03-21 NOTE — Telephone Encounter (Signed)
 Copied from CRM [id number]. Topic: Clinical - Prescription Issue >> Mar 21, 2024 11:12 AM Lotus Round B wrote: Reason for CRM: pt called in stating that [facility] a [facility] losartan  (COZAAR ) 50 MG tablet medication that it might need a approval . If there is anyway Dr.Patel can approve it if not then the pt would like a call if possible . Pts pharmacy is:   [id number] [place], [place] - [address] [facility] [address] [facility] [address] [place] [address] Phone: [phone] Fax: [phone] Hours: Not open 24 hours

## 2024-03-24 ENCOUNTER — Telehealth: Payer: Self-pay | Admitting: Internal Medicine

## 2024-03-24 ENCOUNTER — Other Ambulatory Visit: Payer: Self-pay | Admitting: Internal Medicine

## 2024-03-24 DIAGNOSIS — K649 Unspecified hemorrhoids: Secondary | ICD-10-CM

## 2024-03-24 MED ORDER — LIDOCAINE-HYDROCORT (PERIANAL) 3-0.5 % EX CREA: 1.0000 | TOPICAL_CREAM | Freq: Every day | CUTANEOUS | 0 refills | Status: AC

## 2024-03-24 NOTE — Telephone Encounter (Signed)
 Patient advised.

## 2024-03-24 NOTE — Telephone Encounter (Signed)
 Patient came by office since he had Hemorid surgery, concern bowel movement when Linzess  medicine not take correctly, he has some pain he has heard Lidocaine  with a steroid 5% for hemmroids. Can Dr Lydia Sams send in this medicine? When he does use the Linzess  it works great. Please advise call patient.  [facility]

## 2024-04-07 ENCOUNTER — Other Ambulatory Visit: Payer: Self-pay | Admitting: Internal Medicine

## 2024-04-07 DIAGNOSIS — E1140 Type 2 diabetes mellitus with diabetic neuropathy, unspecified: Secondary | ICD-10-CM

## 2024-04-08 ENCOUNTER — Telehealth: Payer: Self-pay | Admitting: Internal Medicine

## 2024-04-08 DIAGNOSIS — E1140 Type 2 diabetes mellitus with diabetic neuropathy, unspecified: Secondary | ICD-10-CM

## 2024-04-08 NOTE — Telephone Encounter (Unsigned)
 Copied from CRM [id number]. Topic: Clinical - Medication Refill >> Apr 08, 2024 12:41 PM Felizardo Hotter wrote: Medication: oxyCODONE -acetaminophen  (PERCOCET/ROXICET) 5-325 MG tablet  Has the patient contacted their pharmacy? Yes (Agent: If no, request that the patient contact the pharmacy for the refill. If patient does not wish to contact the pharmacy document the reason why and proceed with request.) (Agent: If yes, when and what did the pharmacy advise?)Pharmacy need PCP approval  This is the patient's preferred pharmacy:  [id number] [place], [place] - [address] [facility] [facility] [address] [facility] [address] [place] [address] Phone: [phone] Fax: [phone]   Is this the correct pharmacy for this prescription? Yes If no, delete pharmacy and type the correct one.   Has the prescription been filled recently? Yes  Is the patient out of the medication? Yes  Has the patient been seen for an appointment in the last year OR does the patient have an upcoming appointment? Yes  Can we respond through MyChart? Yes  Agent: Please be advised that Rx refills may take up to 3 business days. We ask that you follow-up with your pharmacy.

## 2024-04-08 NOTE — Telephone Encounter (Unsigned)
 Copied from CRM [id number]. Topic: Clinical - Medication Refill >> Apr 08, 2024  9:39 AM Emylou G wrote: Medication: oxyCODONE -acetaminophen  (PERCOCET/ROXICET) 5-325 MG tablet  Has the patient contacted their pharmacy? No (Agent: If no, request that the patient contact the pharmacy for the refill. If patient does not wish to contact the pharmacy document the reason why and proceed with request.) (Agent: If yes, when and what did the pharmacy advise?)  This is the patient's preferred pharmacy:  [id number] [place], [place] - [address] [facility] [address] [facility] [address] [place] [address] Phone: [phone] Fax: [phone]    Is this the correct pharmacy for this prescription? Yes If no, delete pharmacy and type the correct one.   Has the prescription been filled recently? No  Is the patient out of the medication? Yes  Has the patient been seen for an appointment in the last year OR does the patient have an upcoming appointment? Yes  Can we respond through MyChart? Yes  Agent: Please be advised that Rx refills may take up to 3 business days. We ask that you follow-up with your pharmacy.

## 2024-04-09 MED ORDER — OXYCODONE-ACETAMINOPHEN 5-325 MG PO TABS
1.0000 | ORAL_TABLET | Freq: Three times a day (TID) | ORAL | 0 refills | Status: DC | PRN
Start: 2024-04-09 — End: 2024-05-05

## 2024-04-09 NOTE — Telephone Encounter (Signed)
 Duplicate, see previous telephone encounter dated 04/08/24.

## 2024-05-05 ENCOUNTER — Telehealth: Payer: Self-pay | Admitting: Internal Medicine

## 2024-05-05 ENCOUNTER — Other Ambulatory Visit: Payer: Self-pay | Admitting: Internal Medicine

## 2024-05-05 DIAGNOSIS — E1140 Type 2 diabetes mellitus with diabetic neuropathy, unspecified: Secondary | ICD-10-CM

## 2024-05-05 MED ORDER — OXYCODONE-ACETAMINOPHEN 5-325 MG PO TABS
1.0000 | ORAL_TABLET | Freq: Three times a day (TID) | ORAL | 0 refills | Status: DC | PRN
Start: 2024-05-05 — End: 2024-05-19
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
## 2024-05-05 NOTE — Telephone Encounter (Signed)
 Patient advised.

## 2024-05-07 ENCOUNTER — Other Ambulatory Visit: Payer: Self-pay | Admitting: Internal Medicine

## 2024-05-07 DIAGNOSIS — N522 Drug-induced erectile dysfunction: Secondary | ICD-10-CM

## 2024-05-07 MED ORDER — SILDENAFIL CITRATE 100 MG PO TABS: 100.0000 mg | ORAL_TABLET | Freq: Every day | ORAL | 0 refills | Status: DC | PRN

## 2024-05-12 DIAGNOSIS — E782 Mixed hyperlipidemia: Secondary | ICD-10-CM | POA: Diagnosis not present

## 2024-05-12 DIAGNOSIS — E1140 Type 2 diabetes mellitus with diabetic neuropathy, unspecified: Secondary | ICD-10-CM | POA: Diagnosis not present

## 2024-05-13 LAB — LIPID PANEL
Cholesterol: 117 mg/dL (ref <200)
HDL: 34 mg/dL — ABNORMAL LOW (ref >=40)
LDL Cholesterol (Calc): 61 mg/dL
Non-HDL Cholesterol (Calc): 83 mg/dL (ref <130)
Total CHOL/HDL Ratio: 3.4 (calc) (ref <5.0)
Triglycerides: 134 mg/dL (ref <150)

## 2024-05-13 LAB — HEMOGLOBIN A1C
Hgb A1c MFr Bld: 5.6 % (ref <5.7)
Mean Plasma Glucose: 114 mg/dL
eAG (mmol/L): 6.3 mmol/L

## 2024-05-19 ENCOUNTER — Ambulatory Visit: Admitting: Internal Medicine

## 2024-05-19 ENCOUNTER — Encounter: Payer: Self-pay | Admitting: Internal Medicine

## 2024-05-19 VITALS — BP 90/60 | HR 84 | Ht 74.0 in | Wt 236.2 lb

## 2024-05-19 DIAGNOSIS — G4700 Insomnia, unspecified: Secondary | ICD-10-CM | POA: Diagnosis not present

## 2024-05-19 DIAGNOSIS — K5904 Chronic idiopathic constipation: Secondary | ICD-10-CM | POA: Diagnosis not present

## 2024-05-19 DIAGNOSIS — R259 Unspecified abnormal involuntary movements: Secondary | ICD-10-CM | POA: Diagnosis not present

## 2024-05-19 DIAGNOSIS — I10 Essential (primary) hypertension: Secondary | ICD-10-CM

## 2024-05-19 DIAGNOSIS — E1140 Type 2 diabetes mellitus with diabetic neuropathy, unspecified: Secondary | ICD-10-CM

## 2024-05-19 DIAGNOSIS — N1831 Chronic kidney disease, stage 3a: Secondary | ICD-10-CM | POA: Diagnosis not present

## 2024-05-19 DIAGNOSIS — Z23 Encounter for immunization: Secondary | ICD-10-CM

## 2024-05-19 MED ORDER — CYCLOBENZAPRINE HCL 5 MG PO TABS: 5.0000 mg | ORAL_TABLET | Freq: Two times a day (BID) | ORAL | 1 refills | Status: AC | PRN

## 2024-05-19 MED ORDER — OXYCODONE-ACETAMINOPHEN 5-325 MG PO TABS: 1.0000 | ORAL_TABLET | Freq: Every day | ORAL | 0 refills | Status: DC

## 2024-05-19 NOTE — Assessment & Plan Note (Addendum)
 Had been smoking marijuana for anxiety/insomnia in the past Sleep hygiene material provided On Trazodone  225 mg at bedtime, but still has difficulty maintaining sleep - avoid increasing dose of trazodone  for now

## 2024-05-19 NOTE — Assessment & Plan Note (Addendum)
 Lab Results  Component Value Date   HGBA1C 5.6 05/12/2024   Well-controlled On Jardiance  25 mg QD On Mounjaro  12.5 mg qw - tolerating well, prefers to increase dose later for weight loss benefit as well - no episode of hypoglycemia Advised to follow diabetic diet On statin and ARB F/u CMP and lipid panel Diabetic eye exam: Advised to follow up with Ophthalmology for diabetic eye exam  Did not tolerate Cymbalta  Advised to continue Lyrica  lower dose - 25 mg BID Percocet PRN for severe pain

## 2024-05-19 NOTE — Assessment & Plan Note (Addendum)
 BP Readings from Last 1 Encounters:  05/19/24 90/60   Well controlled with losartan  50 mg once daily Due to low normal BP with postural dizziness, advised to take half tablet of losartan  for now Counseled for compliance with the medications Advised DASH diet and moderate exercise/walking, at least 150 mins/week

## 2024-05-19 NOTE — Assessment & Plan Note (Signed)
 Has chronic neck twitching symptoms, but due to recent involuntary neck muscle contractures leading to neck soreness - had referred to neurology Unclear etiology Had discontinued Lyrica  as it has some incidence of myoclonus, but did not have any change despite stopping now Trial of Flexeril  for spasms

## 2024-05-19 NOTE — Assessment & Plan Note (Signed)
Likely due to underlying DM and HTN Followed by Nephrology Avoid nephrotoxic agents On Losartan and Jardiance 

## 2024-05-19 NOTE — Patient Instructions (Addendum)
 Please take Flexeril  as needed for muscle spasms.  Please take Percocet as needed for severe pain.  Please take half tablet of Losartan  for now.  Please continue to take other medications as prescribed.  Please continue to follow low carb diet and perform moderate exercise/walking at least 150 mins/week.

## 2024-05-19 NOTE — Assessment & Plan Note (Signed)
 Has tried Colace, senna, MiraLAX Have discontinued gabapentin and Elavil in the past that was worsening constipation Takes Linzess - improved now Maintain adequate hydration Prune juice as needed
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
## 2024-05-20 ENCOUNTER — Telehealth: Payer: Self-pay | Admitting: Internal Medicine

## 2024-05-20 NOTE — Telephone Encounter (Signed)
 Copied from CRM [id number]. Topic: Clinical - Medical Advice >> May 20, 2024 10:58 AM Avram MATSU wrote: Reason for CRM: patient stated his pharmacy recommended him taking coconut water  and Pedialyte for electrolyte intake. He wants to know if there are any other recommendations or of those options are safe to take. and would like a call back. [phone] (M)

## 2024-05-20 NOTE — Telephone Encounter (Signed)
Pt informed

## 2024-05-21 LAB — MICROALBUMIN / CREATININE URINE RATIO
Creatinine, Urine: 180.5 mg/dL
Microalb/Creat Ratio: 18 mg/g{creat} (ref 0–29)
Microalbumin, Urine: 31.7 ug/mL

## 2024-06-09 ENCOUNTER — Ambulatory Visit: Payer: Self-pay

## 2024-06-09 DIAGNOSIS — R42 Dizziness and giddiness: Secondary | ICD-10-CM | POA: Diagnosis not present

## 2024-06-09 DIAGNOSIS — R55 Syncope and collapse: Secondary | ICD-10-CM | POA: Diagnosis not present

## 2024-06-09 NOTE — Telephone Encounter (Signed)
 FYI Only or Action Required?: FYI only for provider.  Patient was last seen in primary care on 05/19/2024 by Francisco Suzzane POUR, MD.  Called Nurse Triage reporting Dizziness.  Symptoms began during last appt. With Dr. Tobie on 7/7, symptoms have returned x 1 week ago.  Interventions attempted: Rest, hydration, or home remedies.  Symptoms are: gradually worsening.  Triage Disposition: See Physician Within 24 Hours  Patient/caregiver understands and will follow disposition?: Yes  **Patient agrees to be seen in UC today, and will keep 8/7 appt. For follow-up**           Copied from CRM #[id number]. Topic: Clinical - Red Word Triage >> Jun 09, 2024 11:33 AM Vena H wrote: [facility] that prompted transfer to Nurse Triage: Pt is having episodes of dizziness, nausea and loss of hearing when it happens Reason for Disposition  [1] MODERATE dizziness (e.g., interferes with normal activities) AND [2] has NOT been evaluated by doctor (or NP/PA) for this  (Exception: Dizziness caused by heat exposure, sudden standing, or poor fluid intake.)  Answer Assessment - Initial Assessment Questions 1. DESCRIPTION: Describe your dizziness.     When sitting, then stands, he becomes dizzy. If he doesn't rest after standing he feels nauseated, and goes deaf.  2. LIGHTHEADED: Do you feel lightheaded? (e.g., somewhat faint, woozy, weak upon standing)     Only with standing, not currently   3. VERTIGO: Do you feel like either you or the room is spinning or tilting? (i.e., vertigo)     No   4. SEVERITY: How bad is it?  Do you feel like you are going to faint? Can you stand and walk?     At times, mild, other times moderate.  5. ONSET:  When did the dizziness begin?     X 1 week  6. AGGRAVATING FACTORS: Does anything make it worse? (e.g., standing, change in head position)     Standing         8. CAUSE: What do you think is causing the dizziness? (e.g., decreased fluids or food,  diarrhea, emotional distress, heat exposure, new medicine, sudden standing, vomiting; unknown)     Unknown  9. RECURRENT SYMPTOM: Have you had dizziness before? If Yes, ask: When was the last time? What happened that time?      Mentioned to provider briefly during last visit   10. OTHER SYMPTOMS: Do you have any other symptoms? (e.g., fever, chest pain, vomiting, diarrhea, bleeding)  No   He stated yesterday his monjaro pen malfunctioned. He did not hear a click, and not sure if he received a full dose.... he is back on track with dosage-administration.... just FYI.  Protocols used: Dizziness - Lightheadedness-A-AH

## 2024-06-09 NOTE — Telephone Encounter (Signed)
Patient advised to go to urgent care

## 2024-06-12 ENCOUNTER — Telehealth: Payer: Self-pay | Admitting: Internal Medicine

## 2024-06-12 NOTE — Telephone Encounter (Unsigned)
 Copied from CRM [id number]. Topic: Clinical - Medication Question >> Jun 12, 2024  1:42 PM Tiffini S wrote: Reason for CRM: Patient called about cyclobenzaprine  (FLEXERIL ) 5 MG tablet, patient said medication helped bu is requesting a stronger dosage. Please call at [phone].

## 2024-06-13 ENCOUNTER — Ambulatory Visit: Payer: Self-pay

## 2024-06-13 NOTE — Telephone Encounter (Signed)
 FYI Only or Action Required?: Action required by provider: clinical question for provider.  Patient was last seen in primary care on 05/19/2024 by Tobie Suzzane POUR, MD.  Called Nurse Triage reporting No chief complaint on file..  Symptoms began several years ago.  Interventions attempted: Prescription medications: flexeril .  Symptoms are: unchanged.  Triage Disposition: No disposition on file.  Patient/caregiver understands and will follow disposition?:  Reason for Disposition  Muscle jerks, tics, or shudders are a chronic symptom (recurrent or ongoing AND present > 4 weeks)  Answer Assessment - Initial Assessment Questions Patient states chronic right sided neck twitching x 3 years. States no improvement with flexeril . Messages previously sent to Dr. Tobie but pt was told he is out of the office. Has appt next week with Dr. Antonetta.   1. APPEARANCE of MOVEMENT: What did the jerking or twitching look like? (e.g., body area)     Right side of neck twitches  2. ONSET: When did this start happening? (e.g., hours, days, weeks, months ago)     3 years  Protocols used: Muscle Jerks - Tics - [facility] Copied from CRM [id number]. Topic: Clinical - Red Word Triage >> Jun 13, 2024  4:15 PM Tiffini S wrote: [facility] that prompted transfer to Nurse Triage: Patient called back stating that he is having muscle spasms in the back of the neck daily- pain will not stop. Having been taking muscle relaxer's for 4 to 5 weeks. Needs a stronger dosage.Transferred to call triage nurse.

## 2024-06-16 NOTE — Telephone Encounter (Signed)
Will discuss during ov

## 2024-06-18 ENCOUNTER — Other Ambulatory Visit: Payer: Self-pay | Admitting: Internal Medicine

## 2024-06-18 DIAGNOSIS — I10 Essential (primary) hypertension: Secondary | ICD-10-CM

## 2024-06-19 ENCOUNTER — Ambulatory Visit (INDEPENDENT_AMBULATORY_CARE_PROVIDER_SITE_OTHER): Payer: Self-pay | Admitting: Family Medicine

## 2024-06-19 ENCOUNTER — Other Ambulatory Visit: Payer: Self-pay | Admitting: Internal Medicine

## 2024-06-19 ENCOUNTER — Ambulatory Visit: Payer: Self-pay | Admitting: Family Medicine

## 2024-06-19 ENCOUNTER — Encounter: Payer: Self-pay | Admitting: Family Medicine

## 2024-06-19 ENCOUNTER — Telehealth: Payer: Self-pay | Admitting: Internal Medicine

## 2024-06-19 VITALS — BP 102/69 | HR 78 | Resp 16 | Ht 74.0 in | Wt 237.0 lb

## 2024-06-19 DIAGNOSIS — R42 Dizziness and giddiness: Secondary | ICD-10-CM

## 2024-06-19 DIAGNOSIS — M542 Cervicalgia: Secondary | ICD-10-CM

## 2024-06-19 DIAGNOSIS — E1140 Type 2 diabetes mellitus with diabetic neuropathy, unspecified: Secondary | ICD-10-CM

## 2024-06-19 MED ORDER — OXYCODONE-ACETAMINOPHEN 5-325 MG PO TABS: 1.0000 | ORAL_TABLET | Freq: Every day | ORAL | 0 refills | Status: DC

## 2024-06-19 NOTE — Patient Instructions (Signed)
 F/U with PCP in 6 to 10 weeks  You are referred to chiropractor   You are referred for US  of arteries in your neck due to light headedness  Thanks for choosing [facility], we consider it a privelige to serve you.

## 2024-06-19 NOTE — Telephone Encounter (Signed)
 Prescription Request  06/19/2024  LOV: 05/19/2024  What is the name of the medication or equipment? oxyCODONE -acetaminophen  (PERCOCET/ROXICET) 5-325 MG tablet [[id number]]   Have you contacted your pharmacy to request a refill? Yes   Which pharmacy would you like this sent to?  [facility]  Patient notified that their request is being sent to the clinical staff for review and that they should receive a response within 2 business days.   Please advise at walked into office

## 2024-06-19 NOTE — Telephone Encounter (Signed)
 Copied from CRM [id number]. Topic: Clinical - Medication Refill >> Jun 19, 2024  8:57 AM Avram MATSU wrote: Medication: oxyCODONE -acetaminophen  (PERCOCET/ROXICET) 5-325 MG tablet [[id number]]  Has the patient contacted their pharmacy? Yes (Agent: If no, request that the patient contact the pharmacy for the refill. If patient does not wish to contact the pharmacy document the reason why and proceed with request.) (Agent: If yes, when and what did the pharmacy advise?)  This is the patient's preferred pharmacy:  [id number] [place], [place] - [address] [place] [address] Phone: [phone] Fax: [phone]  Is this the correct pharmacy for this prescription? Yes If no, delete pharmacy and type the correct one.   Has the prescription been filled recently? No  Is the patient out of the medication? Yes  Has the patient been seen for an appointment in the last year OR does the patient have an upcoming appointment? Yes  Can we respond through MyChart? Yes  Agent: Please be advised that Rx refills may take up to 3 business days. We ask that you follow-up with your pharmacy.

## 2024-06-30 ENCOUNTER — Other Ambulatory Visit: Payer: Self-pay | Admitting: Internal Medicine

## 2024-06-30 DIAGNOSIS — E1140 Type 2 diabetes mellitus with diabetic neuropathy, unspecified: Secondary | ICD-10-CM

## 2024-06-30 MED ORDER — TIRZEPATIDE 12.5 MG/0.5ML ~~LOC~~ SOAJ
12.5000 mg | SUBCUTANEOUS | 1 refills | Status: DC
Start: 2024-06-30 — End: 2024-08-19

## 2024-06-30 NOTE — Telephone Encounter (Signed)
 Copied from CRM #[id number]. Topic: Clinical - Medication Refill >> Jun 30, 2024 11:38 AM Cynthia K wrote: Medication: tirzepatide  (MOUNJARO ) 12.5 MG/0.5ML Pen He wants to see if doctor wants to increase before sending the order to refill. He is doing good on the medications.   Has the patient contacted their pharmacy? Yes (Agent: If no, request that the patient contact the pharmacy for the refill. If patient does not wish to contact the pharmacy document the reason why and proceed with request.) (Agent: If yes, when and what did the pharmacy advise?) Pharmacy needs order to refill  This is the patient's preferred pharmacy:  [id number] [place], [place] - [address] [place] [address] Phone: [phone] Fax: [phone]  Is this the correct pharmacy for this prescription? Yes If no, delete pharmacy and type the correct one.   Has the prescription been filled recently? No  Is the patient out of the medication? Yes  Has the patient been seen for an appointment in the last year OR does the patient have an upcoming appointment? Yes  Can we respond through MyChart? Yes  Agent: Please be advised that Rx refills may take up to 3 business days. We ask that you follow-up with your pharmacy.

## 2024-07-02 ENCOUNTER — Other Ambulatory Visit: Payer: Self-pay | Admitting: Internal Medicine

## 2024-07-02 ENCOUNTER — Ambulatory Visit (HOSPITAL_COMMUNITY)
Admission: RE | Admit: 2024-07-02 | Discharge: 2024-07-02 | Disposition: A | Discharge status: Home / Self Care | Source: Ambulatory Visit | Attending: Family Medicine | Admitting: Family Medicine

## 2024-07-02 DIAGNOSIS — I6523 Occlusion and stenosis of bilateral carotid arteries: Secondary | ICD-10-CM | POA: Diagnosis not present

## 2024-07-02 DIAGNOSIS — R42 Dizziness and giddiness: Secondary | ICD-10-CM | POA: Insufficient documentation

## 2024-07-02 DIAGNOSIS — I10 Essential (primary) hypertension: Secondary | ICD-10-CM | POA: Diagnosis not present

## 2024-07-02 DIAGNOSIS — E782 Mixed hyperlipidemia: Secondary | ICD-10-CM

## 2024-07-02 DIAGNOSIS — R519 Headache, unspecified: Secondary | ICD-10-CM | POA: Diagnosis not present

## 2024-07-02 DIAGNOSIS — E119 Type 2 diabetes mellitus without complications: Secondary | ICD-10-CM | POA: Diagnosis not present

## 2024-07-04 ENCOUNTER — Telehealth: Payer: Self-pay

## 2024-07-04 NOTE — Telephone Encounter (Signed)
 Copied from CRM #[id number]. Topic: Referral - Request for Referral >> Jul 04, 2024  3:33 PM Ivette P wrote: Did the patient discuss referral with their provider in the last year? Yes (If No - schedule appointment) (If Yes - send message)  Appointment offered? Yes  Type of order/referral and detailed reason for visit: pt wants to get a referral to ortho   Preference of office, provider, location: anywhere close to [facility]   If referral order, have you been seen by this specialty before? No (If Yes, this issue or another issue? When? Where?  Can we respond through MyChart? Yes

## 2024-07-07 ENCOUNTER — Ambulatory Visit: Payer: Self-pay | Admitting: Family Medicine

## 2024-07-07 ENCOUNTER — Telehealth: Payer: Self-pay | Admitting: Internal Medicine

## 2024-07-07 NOTE — Telephone Encounter (Signed)
 Patient came by the office and needs a referral to a orthopedic surgeon Dr Avel Dr Cindie ([facility]) still having twitching in neck concern pinch nerve.

## 2024-07-07 NOTE — Telephone Encounter (Signed)
Please refer to other TE

## 2024-07-07 NOTE — Telephone Encounter (Signed)
 Left detailed vm

## 2024-07-08 NOTE — Telephone Encounter (Signed)
 Per patient he would like to go to [employer]

## 2024-07-13 DIAGNOSIS — R42 Dizziness and giddiness: Secondary | ICD-10-CM | POA: Insufficient documentation

## 2024-07-13 NOTE — Assessment & Plan Note (Signed)
 Check carotid US  to evaluate further

## 2024-07-13 NOTE — Assessment & Plan Note (Signed)
 Refer to [employer]

## 2024-07-13 NOTE — Progress Notes (Signed)
   ISSAAC Huffman     MRN: [id number]      DOB: [birth year]  Chief Complaint  Patient presents with   Dizziness    Complains of intermittent dizziness x1 month. Worsens after standing, has to take a few minutes before he can start walking.    Neck Pain    Complains of neck pain/stiffness ongoing but getting worse. Was able to find relief with flexeril  at first but now no improvement     HPI [name] is here with above concerns  ROS Denies recent fever or chills. Denies sinus pressure, nasal congestion, ear pain or sore throat. Denies chest congestion, productive cough or wheezing. Denies chest pains, palpitations and leg swelling Denies abdominal pain, nausea, vomiting,diarrhea or constipation.   Denies dysuria, frequency, hesitancy or incontinence. Denies headaches, seizures, numbness, or tingling. Denies depression, anxiety or insomnia. Denies skin break down or rash.   PE  BP 102/69   Pulse 78   Resp 16   Ht 6' 2 (1.88 m)   Wt 237 lb 0.6 oz (107.5 kg)   SpO2 98%   BMI 30.43 kg/m   Patient alert and oriented and in no cardiopulmonary distress.  HEENT: No facial asymmetry, EOMI,     Neck decreased ROM with left spasm .  Chest: Clear to auscultation bilaterally.  CVS: S1, S2 no murmurs, no S3.Regular rate.  ABD: Soft non tender.   Ext: No edema  MS: Adequate ROM spine, shoulders, hips and knees.  Skin: Intact, no ulcerations or rash noted.  Psych: Good eye contact, normal affect. Memory intact not anxious or depressed appearing.  CNS: CN 2-12 intact, power,  normal throughout.no focal deficits noted.   Assessment & Plan  Light headedness Check carotid US  to evaluate further  Neck pain of over 3 months duration Refer to chirpopractor

## 2024-07-16 ENCOUNTER — Other Ambulatory Visit: Payer: Self-pay

## 2024-07-16 DIAGNOSIS — M542 Cervicalgia: Secondary | ICD-10-CM

## 2024-07-16 DIAGNOSIS — M47812 Spondylosis without myelopathy or radiculopathy, cervical region: Secondary | ICD-10-CM

## 2024-07-17 ENCOUNTER — Telehealth: Payer: Self-pay | Admitting: Internal Medicine

## 2024-07-17 NOTE — Telephone Encounter (Unsigned)
 Copied from CRM [id number]. Topic: Clinical - Medication Refill >> Jul 17, 2024  4:35 PM Turkey B wrote: Medication: oxyCODONE -acetaminophen  (PERCOCET/ROXICET) 5-325 MG  Has the patient contacted their pharmacy? No   This is the patient's preferred pharmacy:  [id number] - [place], [place] - [address] [place] [address] Phone: [phone] Fax: [phone]    Is this the correct pharmacy for this prescription? yes .   Has the prescription been filled recently? no  Is the patient out of the medication? Will be today  Has the patient been seen for an appointment in the last year OR does the patient have an upcoming appointment? yes  Can we respond through MyChart? yes  Agent: Please be advised that Rx refills may take up to 3 business days. We ask that you follow-up with your pharmacy.

## 2024-07-18 ENCOUNTER — Other Ambulatory Visit: Payer: Self-pay | Admitting: Internal Medicine

## 2024-07-18 DIAGNOSIS — E1140 Type 2 diabetes mellitus with diabetic neuropathy, unspecified: Secondary | ICD-10-CM

## 2024-07-18 MED ORDER — OXYCODONE-ACETAMINOPHEN 5-325 MG PO TABS: 1.0000 | ORAL_TABLET | Freq: Every day | ORAL | 0 refills | Status: DC

## 2024-07-24 ENCOUNTER — Telehealth: Payer: Self-pay | Admitting: Internal Medicine

## 2024-07-24 NOTE — Telephone Encounter (Signed)
 Referral sent to: [facility] at [facility] [address] [place] [address] [phone]  MyChart Message sent to Patient with Specialty Office contact information.

## 2024-07-24 NOTE — Telephone Encounter (Signed)
 Copied from CRM [id number]. Topic: Referral - Status >> Jul 24, 2024 11:03 AM Berwyn MATSU wrote: Reason for CRM: Patient called in requesting an update on his referral for orthopedic. Per patient he has not heard back and was submitted since 07/16/24.  May you please assist.

## 2024-08-04 DIAGNOSIS — D631 Anemia in chronic kidney disease: Secondary | ICD-10-CM | POA: Diagnosis not present

## 2024-08-04 DIAGNOSIS — N189 Chronic kidney disease, unspecified: Secondary | ICD-10-CM | POA: Diagnosis not present

## 2024-08-04 DIAGNOSIS — R809 Proteinuria, unspecified: Secondary | ICD-10-CM | POA: Diagnosis not present

## 2024-08-04 DIAGNOSIS — I10 Essential (primary) hypertension: Secondary | ICD-10-CM | POA: Diagnosis not present

## 2024-08-04 DIAGNOSIS — E559 Vitamin D deficiency, unspecified: Secondary | ICD-10-CM | POA: Diagnosis not present

## 2024-08-07 ENCOUNTER — Encounter: Payer: Self-pay | Admitting: Physical Medicine and Rehabilitation

## 2024-08-07 ENCOUNTER — Ambulatory Visit (INDEPENDENT_AMBULATORY_CARE_PROVIDER_SITE_OTHER): Admitting: Physical Medicine and Rehabilitation

## 2024-08-07 VITALS — BP 128/84 | HR 70

## 2024-08-07 DIAGNOSIS — R259 Unspecified abnormal involuntary movements: Secondary | ICD-10-CM

## 2024-08-07 DIAGNOSIS — M7918 Myalgia, other site: Secondary | ICD-10-CM

## 2024-08-07 DIAGNOSIS — M542 Cervicalgia: Secondary | ICD-10-CM | POA: Diagnosis not present

## 2024-08-07 MED ORDER — TIZANIDINE HCL 4 MG PO TABS: 4.0000 mg | ORAL_TABLET | Freq: Three times a day (TID) | ORAL | 0 refills | Status: AC | PRN
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
## 2024-08-13 ENCOUNTER — Other Ambulatory Visit: Payer: Self-pay | Admitting: Internal Medicine

## 2024-08-13 DIAGNOSIS — N522 Drug-induced erectile dysfunction: Secondary | ICD-10-CM

## 2024-08-13 MED ORDER — SILDENAFIL CITRATE 100 MG PO TABS: 100.0000 mg | ORAL_TABLET | Freq: Every day | ORAL | 1 refills | Status: AC | PRN

## 2024-08-13 NOTE — Telephone Encounter (Signed)
 Copied from CRM #[id number]. Topic: Clinical - Medication Refill >> Aug 13, 2024 12:32 PM Emylou G wrote: Medication: sildenafil  (VIAGRA ) 100 MG tablet  Has the patient contacted their pharmacy? No (Agent: If no, request that the patient contact the pharmacy for the refill. If patient does not wish to contact the pharmacy document the reason why and proceed with request.) (Agent: If yes, when and what did the pharmacy advise?)  This is the patient's preferred pharmacy:    [facility] [address], [place] - [address] [place] [address] Phone: [phone] Fax: [phone]  Is this the correct pharmacy for this prescription? Yes If no, delete pharmacy and type the correct one.   Has the prescription been filled recently? No  Is the patient out of the medication? Yes  Has the patient been seen for an appointment in the last year OR does the patient have an upcoming appointment? Yes  Can we respond through MyChart? Yes  Agent: Please be advised that Rx refills may take up to 3 business days. We ask that you follow-up with your pharmacy.
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
## 2024-08-14 ENCOUNTER — Other Ambulatory Visit: Payer: Self-pay | Admitting: Internal Medicine

## 2024-08-14 ENCOUNTER — Encounter: Payer: Self-pay | Admitting: Physical Therapy

## 2024-08-14 ENCOUNTER — Ambulatory Visit: Attending: Physical Medicine and Rehabilitation | Admitting: Physical Therapy

## 2024-08-14 DIAGNOSIS — M6281 Muscle weakness (generalized): Secondary | ICD-10-CM | POA: Diagnosis not present

## 2024-08-14 DIAGNOSIS — M79601 Pain in right arm: Secondary | ICD-10-CM | POA: Diagnosis not present

## 2024-08-14 DIAGNOSIS — M7918 Myalgia, other site: Secondary | ICD-10-CM | POA: Insufficient documentation

## 2024-08-14 DIAGNOSIS — R259 Unspecified abnormal involuntary movements: Secondary | ICD-10-CM | POA: Insufficient documentation

## 2024-08-14 DIAGNOSIS — M542 Cervicalgia: Secondary | ICD-10-CM | POA: Diagnosis not present

## 2024-08-14 DIAGNOSIS — E1140 Type 2 diabetes mellitus with diabetic neuropathy, unspecified: Secondary | ICD-10-CM

## 2024-08-14 MED ORDER — OXYCODONE-ACETAMINOPHEN 5-325 MG PO TABS
1.0000 | ORAL_TABLET | Freq: Every day | ORAL | 0 refills | Status: DC
Start: 2024-08-17 — End: 2024-09-16

## 2024-08-14 NOTE — Telephone Encounter (Signed)
 Copied from CRM [id number]. Topic: Clinical - Medication Refill >> Aug 14, 2024  2:54 PM Winona R wrote: Medication: oxyCODONE -acetaminophen  (PERCOCET/ROXICET) 5-325 MG tablet  Has the patient contacted their pharmacy? Yes (Agent: If no, request that the patient contact the pharmacy for the refill. If patient does not wish to contact the pharmacy document the reason why and proceed with request.) (Agent: If yes, when and what did the pharmacy advise?)  This is the patient's preferred pharmacy:  [id number] - [place], [place] - [address] [place] [address] Phone: [phone] Fax: [phone]    Is this the correct pharmacy for this prescription? Yes If no, delete pharmacy and type the correct one.   Has the prescription been filled recently? Yes  Is the patient out of the medication? Yes  Has the patient been seen for an appointment in the last year OR does the patient have an upcoming appointment? Yes  Can we respond through MyChart? Yes  Agent: Please be advised that Rx refills may take up to 3 business days. We ask that you follow-up with your pharmacy.

## 2024-08-18 ENCOUNTER — Ambulatory Visit: Payer: Medicare HMO

## 2024-08-18 ENCOUNTER — Encounter: Payer: Self-pay | Admitting: Physical Medicine and Rehabilitation

## 2024-08-18 VITALS — Ht 74.0 in | Wt 235.0 lb

## 2024-08-18 DIAGNOSIS — Z0000 Encounter for general adult medical examination without abnormal findings: Secondary | ICD-10-CM

## 2024-08-18 NOTE — Patient Instructions (Signed)
 Francisco Huffman,  Thank you for taking the time for your Medicare Wellness Visit. I appreciate your continued commitment to your health goals. Please review the care plan we discussed, and feel free to reach out if I can assist you further.  Medicare recommends these wellness visits once per year to help you and your care team stay ahead of potential health issues. These visits are designed to focus on prevention, allowing your provider to concentrate on managing your acute and chronic conditions during your regular appointments.  Please note that Annual Wellness Visits do not include a physical exam. Some assessments may be limited, especially if the visit was conducted virtually. If needed, we may recommend a separate in-person follow-up with your provider.  Wishing you excellent health and many blessings in the year to come!  -Aylin Rhoads, CMA  Ongoing Care Seeing your primary care provider every 3 to 6 months helps us  monitor your health and provide consistent, personalized care.   Recommended Screenings:  Health Maintenance  Topic Date Due   Hepatitis B Vaccine (1 of 3 - 19+ 3-dose series) Never done   Eye exam for diabetics  11/11/2023   Yearly kidney function blood test for diabetes  05/13/2024   Flu Shot  06/13/2024   Complete foot exam   10/17/2024   Cologuard (Stool DNA test)  11/10/2024   Hemoglobin A1C  11/11/2024   Yearly kidney health urinalysis for diabetes  05/19/2025   Medicare Annual Wellness Visit  08/18/2025   DTaP/Tdap/Td vaccine (2 - Td or Tdap) 11/17/2032   Pneumococcal Vaccine for age over 57  Completed   Hepatitis C Screening  Completed   HIV Screening  Completed   Zoster (Shingles) Vaccine  Completed   HPV Vaccine  Aged Out   Meningitis B Vaccine  Aged Out   COVID-19 Vaccine  Discontinued       08/18/2024    1:56 PM  Advanced Directives  Does Patient Have a Medical Advance Directive? No  Would patient like information on creating a medical advance directive? No -  Patient declined   Advance Care Planning is important because it: Ensures you receive medical care that aligns with your values, goals, and preferences. Provides guidance to your family and loved ones, reducing the emotional burden of decision-making during critical moments.  Vision: Annual vision screenings are recommended for early detection of glaucoma, cataracts, and diabetic retinopathy. These exams can also reveal signs of chronic conditions such as diabetes and high blood pressure.  Dental: Annual dental screenings help detect early signs of oral cancer, gum disease, and other conditions linked to overall health, including heart disease and diabetes.  Please see the attached documents for additional preventive care recommendations.

## 2024-08-18 NOTE — Telephone Encounter (Unsigned)
 Copied from CRM [id number]. Topic: Clinical - Medication Question >> Aug 15, 2024  1:37 PM Selinda RAMAN wrote: Reason for CRM: The patient called in to check on his Oxycodone  refill request. I told him it was sent in and confirmed yesterday at 5:01 pm to his pharmacy. He said he will give them a call and he was very appreciative.
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
## 2024-08-19 ENCOUNTER — Encounter: Payer: Self-pay | Admitting: Internal Medicine

## 2024-08-19 ENCOUNTER — Ambulatory Visit (INDEPENDENT_AMBULATORY_CARE_PROVIDER_SITE_OTHER): Admitting: Internal Medicine

## 2024-08-19 VITALS — BP 108/75 | HR 75 | Ht 74.0 in | Wt 235.2 lb

## 2024-08-19 DIAGNOSIS — I10 Essential (primary) hypertension: Secondary | ICD-10-CM | POA: Diagnosis not present

## 2024-08-19 DIAGNOSIS — E1140 Type 2 diabetes mellitus with diabetic neuropathy, unspecified: Secondary | ICD-10-CM

## 2024-08-19 DIAGNOSIS — Z0001 Encounter for general adult medical examination with abnormal findings: Secondary | ICD-10-CM | POA: Diagnosis not present

## 2024-08-19 DIAGNOSIS — Z7984 Long term (current) use of oral hypoglycemic drugs: Secondary | ICD-10-CM

## 2024-08-19 DIAGNOSIS — Z23 Encounter for immunization: Secondary | ICD-10-CM | POA: Diagnosis not present

## 2024-08-19 DIAGNOSIS — N1831 Chronic kidney disease, stage 3a: Secondary | ICD-10-CM | POA: Diagnosis not present

## 2024-08-19 DIAGNOSIS — F330 Major depressive disorder, recurrent, mild: Secondary | ICD-10-CM | POA: Insufficient documentation

## 2024-08-19 DIAGNOSIS — Z7985 Long-term (current) use of injectable non-insulin antidiabetic drugs: Secondary | ICD-10-CM

## 2024-08-19 DIAGNOSIS — F4310 Post-traumatic stress disorder, unspecified: Secondary | ICD-10-CM | POA: Diagnosis not present

## 2024-08-19 MED ORDER — PREGABALIN 25 MG PO CAPS: 25.0000 mg | ORAL_CAPSULE | Freq: Two times a day (BID) | ORAL | 3 refills | Status: AC

## 2024-08-19 MED ORDER — TIRZEPATIDE 15 MG/0.5ML ~~LOC~~ SOAJ
15.0000 mg | SUBCUTANEOUS | 3 refills | Status: AC
Start: 2024-08-19 — End: ?
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
## 2024-08-19 NOTE — Assessment & Plan Note (Signed)

## 2024-08-19 NOTE — Assessment & Plan Note (Addendum)
 Lab Results  Component Value Date   HGBA1C 5.6 05/12/2024   Well-controlled On Jardiance  25 mg QD On Mounjaro  12.5 mg qw - tolerating well, prefers to increase dose to 15 mg qw for weight loss benefit as well - no episode of hypoglycemia Advised to follow diabetic diet On statin and ARB F/u CMP and lipid panel Diabetic eye exam: Advised to follow up with Ophthalmology for diabetic eye exam  Did not tolerate Cymbalta  Advised to continue Lyrica  lower dose - 25 mg BID Percocet PRN for severe pain
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
## 2024-08-19 NOTE — Assessment & Plan Note (Signed)
 Has varying mood on some days On trazodone  225 mg qHS for insomnia If persistent or worsening mood symptoms, can consider SSRI - especially Prozac due to concern for OCD as well

## 2024-08-19 NOTE — Patient Instructions (Addendum)
 Please start taking Mounjaro  15 mg once weekly once you complete supplies of 12.5 mg.  Please continue to take other medications as prescribed.  Please continue to follow low carb diet and perform moderate exercise/walking at least 150 mins/week.

## 2024-08-19 NOTE — Assessment & Plan Note (Signed)
 BP Readings from Last 1 Encounters:  08/19/24 108/75   Well controlled with losartan  25 mg once daily Due to low normal BP with postural dizziness, had reduced dose of Losartan  Counseled for compliance with the medications Advised DASH diet and moderate exercise/walking, at least 150 mins/week

## 2024-08-19 NOTE — Assessment & Plan Note (Signed)
Likely due to underlying DM and HTN Followed by Nephrology Avoid nephrotoxic agents On Losartan and Jardiance 

## 2024-08-20 LAB — BAYER DCA HB A1C WAIVED: HB A1C (BAYER DCA - WAIVED): 5.4 % (ref 4.8–5.6)

## 2024-08-20 NOTE — Telephone Encounter (Signed)
 Message addressed.

## 2024-08-20 NOTE — Telephone Encounter (Unsigned)
 Copied from CRM #[id number]. Topic: Clinical - Lab/Test Results >> Aug 19, 2024  4:50 PM Joesph B wrote: Reason for CRM: patient is calling for results (A1C)

## 2024-08-22 ENCOUNTER — Ambulatory Visit
Admission: RE | Admit: 2024-08-22 | Discharge: 2024-08-22 | Disposition: A | Discharge status: Home / Self Care | Source: Ambulatory Visit | Attending: Physical Medicine and Rehabilitation | Admitting: Physical Medicine and Rehabilitation

## 2024-08-22 DIAGNOSIS — M50222 Other cervical disc displacement at C5-C6 level: Secondary | ICD-10-CM | POA: Diagnosis not present

## 2024-08-25 DIAGNOSIS — E1122 Type 2 diabetes mellitus with diabetic chronic kidney disease: Secondary | ICD-10-CM | POA: Diagnosis not present

## 2024-08-25 DIAGNOSIS — R809 Proteinuria, unspecified: Secondary | ICD-10-CM | POA: Diagnosis not present

## 2024-08-25 DIAGNOSIS — E1129 Type 2 diabetes mellitus with other diabetic kidney complication: Secondary | ICD-10-CM | POA: Diagnosis not present

## 2024-08-25 DIAGNOSIS — N1831 Chronic kidney disease, stage 3a: Secondary | ICD-10-CM | POA: Diagnosis not present

## 2024-08-28 ENCOUNTER — Telehealth: Payer: Self-pay

## 2024-08-28 NOTE — Telephone Encounter (Signed)
 Copied from CRM [id number]. Topic: Referral - Request for Referral >> Aug 27, 2024  5:17 PM Francisco Huffman wrote: Did the patient discuss referral with their provider in the last year? No (If No - schedule appointment) (If Yes - send message)  Appointment offered? No  Type of order/referral and detailed reason for visit PT is requesting home health aide but wants one of his family members to do it.  Preference of office, provider, location: [facility]  If referral order, have you been seen by this specialty before? No (If Yes, this issue or another issue? When? Where?  Can we respond through MyChart? Yes

## 2024-08-29 NOTE — Telephone Encounter (Signed)
 My chart message sent per patient request.

## 2024-09-11 ENCOUNTER — Ambulatory Visit (INDEPENDENT_AMBULATORY_CARE_PROVIDER_SITE_OTHER): Admitting: Physical Medicine and Rehabilitation

## 2024-09-11 ENCOUNTER — Encounter: Payer: Self-pay | Admitting: Physical Medicine and Rehabilitation

## 2024-09-11 DIAGNOSIS — R259 Unspecified abnormal involuntary movements: Secondary | ICD-10-CM

## 2024-09-11 DIAGNOSIS — M7918 Myalgia, other site: Secondary | ICD-10-CM | POA: Diagnosis not present

## 2024-09-11 DIAGNOSIS — M542 Cervicalgia: Secondary | ICD-10-CM
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
## 2024-09-11 NOTE — Progress Notes (Unsigned)
 Pain Scale   Average Pain 0 Patient advising he has neck spasms and no pain, patient here for MRI review.        +Driver, -BT, -Dye Allergies.

## 2024-09-13 ENCOUNTER — Other Ambulatory Visit: Payer: Self-pay | Admitting: Internal Medicine

## 2024-09-13 DIAGNOSIS — I10 Essential (primary) hypertension: Secondary | ICD-10-CM

## 2024-09-15 ENCOUNTER — Encounter: Payer: Self-pay | Admitting: Radiology

## 2024-09-16 ENCOUNTER — Other Ambulatory Visit: Payer: Self-pay | Admitting: Internal Medicine

## 2024-09-16 DIAGNOSIS — E1140 Type 2 diabetes mellitus with diabetic neuropathy, unspecified: Secondary | ICD-10-CM

## 2024-09-16 MED ORDER — OXYCODONE-ACETAMINOPHEN 5-325 MG PO TABS: 1.0000 | ORAL_TABLET | Freq: Every day | ORAL | 0 refills | Status: DC | PRN

## 2024-09-16 NOTE — Telephone Encounter (Signed)
 Copied from CRM [id number]. Topic: Clinical - Medication Refill >> Sep 16, 2024  9:58 AM Zebedee SAUNDERS wrote: Medication: oxyCODONE -acetaminophen  (PERCOCET/ROXICET) 5-325 MG tablet  Has the patient contacted their pharmacy? Yes (Agent: If no, request that the patient contact the pharmacy for the refill. If patient does not wish to contact the pharmacy document the reason why and proceed with request.) (Agent: If yes, when and what did the pharmacy advise?)  This is the patient's preferred pharmacy:  [id number] - [place], [place] - [address] [place] [address] Phone: [phone] Fax: [phone]  Is this the correct pharmacy for this prescription? Yes If no, delete pharmacy and type the correct one.   Has the prescription been filled recently? Yes  Is the patient out of the medication? Yes  Has the patient been seen for an appointment in the last year OR does the patient have an upcoming appointment? Yes  Can we respond through MyChart? Yes  Agent: Please be advised that Rx refills may take up to 3 business days. We ask that you follow-up with your pharmacy.

## 2024-09-17 ENCOUNTER — Ambulatory Visit: Admitting: Internal Medicine

## 2024-09-17 ENCOUNTER — Encounter: Payer: Self-pay | Admitting: Internal Medicine

## 2024-09-17 VITALS — BP 90/62 | HR 69 | Ht 74.0 in | Wt 238.0 lb

## 2024-09-17 DIAGNOSIS — F429 Obsessive-compulsive disorder, unspecified: Secondary | ICD-10-CM | POA: Diagnosis not present

## 2024-09-17 DIAGNOSIS — G4700 Insomnia, unspecified: Secondary | ICD-10-CM

## 2024-09-17 DIAGNOSIS — F411 Generalized anxiety disorder: Secondary | ICD-10-CM | POA: Diagnosis not present

## 2024-09-17 DIAGNOSIS — I10 Essential (primary) hypertension: Secondary | ICD-10-CM

## 2024-09-17 MED ORDER — FLUOXETINE HCL 10 MG PO CAPS: 10.0000 mg | ORAL_CAPSULE | Freq: Every day | ORAL | 3 refills | Status: DC

## 2024-09-17 NOTE — Assessment & Plan Note (Signed)
 Repetitive thoughts and constant urge to move neck and extremities likely related to OCD Started Prozac 10 mg QD, will adjust dose according to response Would prefer BH therapy as well if he agrees, will discuss in the next visit
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
## 2024-09-17 NOTE — Assessment & Plan Note (Signed)
 Has generalized anxiety in addition to constant urge of movements Started Prozac, considering coexistent OCD as well Continue trazodone  225 mg nightly as needed for insomnia Advised simple relaxation techniques for stress reduction

## 2024-09-17 NOTE — Patient Instructions (Signed)
 Please start taking Prozac as prescribed.  Please continue to take medications as prescribed.  Please continue to follow low carb diet and perform moderate exercise/walking as tolerated.

## 2024-09-17 NOTE — Assessment & Plan Note (Signed)
 BP Readings from Last 1 Encounters:  09/17/24 90/62   Well controlled with losartan  25 mg once daily Due to low normal BP with postural dizziness, had reduced dose of Losartan  Counseled for compliance with the medications Advised DASH diet and moderate exercise/walking, at least 150 mins/week

## 2024-09-17 NOTE — Assessment & Plan Note (Signed)
 Had been smoking marijuana for anxiety/insomnia in the past Sleep hygiene material provided On Trazodone  225 mg at bedtime, but still has difficulty maintaining sleep - avoid increasing dose of trazodone  for now

## 2024-09-21 ENCOUNTER — Other Ambulatory Visit: Payer: Self-pay | Admitting: Internal Medicine

## 2024-09-21 DIAGNOSIS — E1140 Type 2 diabetes mellitus with diabetic neuropathy, unspecified: Secondary | ICD-10-CM

## 2024-10-03 ENCOUNTER — Telehealth: Payer: Self-pay

## 2024-10-03 NOTE — Telephone Encounter (Signed)
 Copied from CRM #[id number]. Topic: Referral - Question >> Oct 03, 2024 11:02 AM Travis F wrote: Reason for CRM: Patient is calling in because he was put on Prozac  and says it is not working. Patient is requesting a referral be sent to Brain Neurologist in [place]. Please follow up with patient.

## 2024-10-06 ENCOUNTER — Other Ambulatory Visit: Payer: Self-pay | Admitting: Internal Medicine

## 2024-10-06 ENCOUNTER — Telehealth: Payer: Self-pay | Admitting: Internal Medicine

## 2024-10-06 DIAGNOSIS — F429 Obsessive-compulsive disorder, unspecified: Secondary | ICD-10-CM

## 2024-10-06 NOTE — Telephone Encounter (Signed)
 Patient came by office checking on the referral to brain neurologist, said it is one here in [place]. Patient # [phone].

## 2024-10-06 NOTE — Telephone Encounter (Signed)
Attempted to contact pt; unable to leave vm 

## 2024-10-06 NOTE — Telephone Encounter (Signed)
 Patient came by and said the FLUoxetine  (PROZAC ) 10 MG capsule  seems not to be working been taking now for 3 weeks. Next step?

## 2024-10-06 NOTE — Telephone Encounter (Signed)
 Please see other encounter.

## 2024-10-07 NOTE — Telephone Encounter (Signed)
 Patient calling to inform Dr. Tobie he has made an appointment with [facility] Neurosurgeon In [place]. Patient scheduled for 11/10/24.

## 2024-10-09 ENCOUNTER — Other Ambulatory Visit: Payer: Self-pay | Admitting: Internal Medicine

## 2024-10-09 DIAGNOSIS — F411 Generalized anxiety disorder: Secondary | ICD-10-CM

## 2024-10-09 DIAGNOSIS — F429 Obsessive-compulsive disorder, unspecified: Secondary | ICD-10-CM

## 2024-10-15 ENCOUNTER — Other Ambulatory Visit: Payer: Self-pay | Admitting: Internal Medicine

## 2024-10-16 ENCOUNTER — Telehealth: Payer: Self-pay

## 2024-10-16 ENCOUNTER — Other Ambulatory Visit: Payer: Self-pay | Admitting: Internal Medicine

## 2024-10-16 DIAGNOSIS — E1140 Type 2 diabetes mellitus with diabetic neuropathy, unspecified: Secondary | ICD-10-CM

## 2024-10-16 MED ORDER — OXYCODONE-ACETAMINOPHEN 5-325 MG PO TABS: 1.0000 | ORAL_TABLET | Freq: Every day | ORAL | 0 refills | Status: DC | PRN

## 2024-10-16 NOTE — Telephone Encounter (Signed)
 Copied from CRM #[id number]. Topic: Clinical - Medication Refill >> Oct 15, 2024 12:14 PM Geneva B wrote: Medication: oxyCODONE -acetaminophen  (PERCOCET/ROXICET) 5-325 MG tablet   Has the patient contacted their pharmacy? Yes (Agent: If no, request that the patient contact the pharmacy for the refill. If patient does not wish to contact the pharmacy document the reason why and proceed with request.) (Agent: If yes, when and what did the pharmacy advise?)  This is the patient's preferred pharmacy:  [id number] [place], [place] - [address] [facility] [address] [place] [address] Phone: [phone] Fax: [phone]  [facility] [address], [place] - [address] [place] [address] Phone: [phone] Fax: [phone]  Is this the correct pharmacy for this prescription? Yes If no, delete pharmacy and type the correct one.   Has the prescription been filled recently? Yes  Is the patient out of the medication? Yes  Has the patient been seen for an appointment in the last year OR does the patient have an upcoming appointment? Yes  Can we respond through MyChart? No  Agent: Please be advised that Rx refills may take up to 3 business days. We ask that you follow-up with your pharmacy. >> Oct 16, 2024 12:35 PM Sophia H wrote: Patient called to check in on refill request - preferred pharmacy: [id number] [place], [place] - [address] AT [facility] [address] [place] [address] Phone: [phone] Fax: [phone]  Aware of 3 day turnaround.

## 2024-10-16 NOTE — Telephone Encounter (Signed)
 Patient advised.

## 2024-10-22 ENCOUNTER — Telehealth: Payer: Self-pay

## 2024-10-22 LAB — HM DIABETES EYE EXAM

## 2024-10-22 NOTE — Telephone Encounter (Signed)
 Mychart message sent to pt.

## 2024-10-22 NOTE — Telephone Encounter (Signed)
 Copied from CRM #[id number]. Topic: Referral - Request for Referral >> Oct 22, 2024 11:14 AM Harlene ORN wrote: Patient was written a referral for Compulsive Disorder at [facility] The Provider he was referred to wasn't available until May of next year. Please write him another referral for a Provider that's available much sooner. Call back patient when referral is ready.

## 2024-10-22 NOTE — Addendum Note (Signed)
 Addended byBETHA TOBIE DOWNS on: 10/22/2024 12:04 PM   Modules accepted: Orders

## 2024-10-24 ENCOUNTER — Encounter: Payer: Self-pay | Admitting: Internal Medicine

## 2024-10-24 NOTE — Telephone Encounter (Signed)
 Spoke with pt about referral to [facility]. He is aware that the office policy is to transfer all mental and substance care is required. He does not want that at this time. Pt is aware that if he wants therapy care he can be referred to [facility] in [place]. Pt says that he will wait to decide after his appointment with the neurologist on what to do about this psychiatry referral.

## 2024-10-24 NOTE — Telephone Encounter (Signed)
 LMTCB regarding referral to [facility].

## 2024-10-30 ENCOUNTER — Telehealth: Payer: Self-pay | Admitting: Internal Medicine

## 2024-10-30 NOTE — Telephone Encounter (Signed)
 Copied from CRM #[id number]. Topic: Referral - Status >> Oct 30, 2024  9:56 AM Graeme ORN wrote: Reason for CRM:  April called from [facility] [phone]. Received referral for patient. Unable to see patients currently on pain medication. They provided treatment to help patients get off of pain medication. Referral denied. Thank You
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
## 2024-11-03 ENCOUNTER — Telehealth: Payer: Self-pay

## 2024-11-03 ENCOUNTER — Other Ambulatory Visit: Payer: Self-pay | Admitting: Internal Medicine

## 2024-11-03 DIAGNOSIS — F411 Generalized anxiety disorder: Secondary | ICD-10-CM

## 2024-11-03 DIAGNOSIS — F429 Obsessive-compulsive disorder, unspecified: Secondary | ICD-10-CM

## 2024-11-03 MED ORDER — FLUOXETINE HCL 20 MG PO CAPS: 20.0000 mg | ORAL_CAPSULE | Freq: Every day | ORAL | 1 refills | Status: AC

## 2024-11-03 NOTE — Telephone Encounter (Signed)
 Copied from CRM #[id number]. Topic: Clinical - Medication Question >> Nov 03, 2024  1:54 PM Winona R wrote: Pt would like to know if his FLUoxetine  (PROZAC ) 10 MG capsule can be increased as it does seem to be helping

## 2024-11-03 NOTE — Addendum Note (Signed)
 Addended byBETHA TOBIE DOWNS on: 11/03/2024 04:48 PM   Modules accepted: Orders

## 2024-11-07 NOTE — Telephone Encounter (Signed)
 Pt is requesting xanax prescription as needed please advise

## 2024-11-10 ENCOUNTER — Ambulatory Visit: Admitting: Orthopedic Surgery

## 2024-11-10 ENCOUNTER — Telehealth: Payer: Self-pay

## 2024-11-10 NOTE — Telephone Encounter (Signed)
 Patient has called to advise that he only needs a 10 day prescription for the medication (PROZAC ). Please contact him for further discussion.  Phone number: [phone]

## 2024-11-10 NOTE — Telephone Encounter (Signed)
 Lmtrc

## 2024-11-10 NOTE — Telephone Encounter (Signed)
 Copied from CRM #[id number]. Topic: Clinical - Medication Question >> Nov 10, 2024  2:00 PM Lauren C wrote: Reason for CRM: I let pt know that Dr. Tobie would be willing to discuss as needed xanax at next visit, per telephone encounter. (Regarding telephone encounter from today, patient was asking for a 10 day supply of xanax to try as needed, not prozac .) I tried to look for sooner appt, but nothing available until 2/10. I did add him to wait list. Would Dr. Tobie be okay with him seeing another provider at [facility] or have anything sooner for medication consult? #[phone]

## 2024-11-11 NOTE — Telephone Encounter (Signed)
 Called patient left voicemail to call office to confirm 1/7 or move this appt.

## 2024-11-13 ENCOUNTER — Other Ambulatory Visit: Payer: Self-pay | Admitting: Medical Genetics

## 2024-11-14 ENCOUNTER — Other Ambulatory Visit: Payer: Self-pay | Admitting: Internal Medicine

## 2024-11-14 DIAGNOSIS — E1140 Type 2 diabetes mellitus with diabetic neuropathy, unspecified: Secondary | ICD-10-CM

## 2024-11-14 NOTE — Telephone Encounter (Signed)
 Copied from CRM [id number]. Topic: Clinical - Medication Refill >> Nov 14, 2024  1:46 PM Montie POUR wrote: Medication:  oxyCODONE -acetaminophen  (PERCOCET/ROXICET) 5-325 MG tablet He will be out of medication on 11/16/24 (Sunday)  Has the patient contacted their pharmacy? Yes (Agent: If no, request that the patient contact the pharmacy for the refill. If patient does not wish to contact the pharmacy document the reason why and proceed with request.) (Agent: If yes, when and what did the pharmacy advise?) Pharmacy needs order to refill  This is the patient's preferred pharmacy:  [id number] [place], [place] - [address] [facility] [address] [place] [address] Phone: [phone] Fax: [phone]  Is this the correct pharmacy for this prescription? Yes If no, delete pharmacy and type the correct one.   Has the prescription been filled recently? No  Is the patient out of the medication? No  Has the patient been seen for an appointment in the last year OR does the patient have an upcoming appointment? Yes  Can we respond through MyChart? Yes  Agent: Please be advised that Rx refills may take up to 3 business days. We ask that you follow-up with your pharmacy.

## 2024-11-17 ENCOUNTER — Other Ambulatory Visit: Payer: Self-pay | Admitting: Internal Medicine

## 2024-11-17 ENCOUNTER — Telehealth: Payer: Self-pay | Admitting: Internal Medicine

## 2024-11-17 MED ORDER — OXYCODONE-ACETAMINOPHEN 5-325 MG PO TABS: 1.0000 | ORAL_TABLET | Freq: Every day | ORAL | 0 refills | Status: DC | PRN

## 2024-11-17 NOTE — Telephone Encounter (Signed)
 Prescription Request  11/17/2024  LOV: 09/17/2024  What is the name of the medication or equipment? oxyCODONE -acetaminophen  (PERCOCET/ROXICET) 5-325 MG tablet [[id number]]   Have you contacted your pharmacy to request a refill? Yes   Which pharmacy would you like this sent to?  [facility]    Patient notified that their request is being sent to the clinical staff for review and that they should receive a response within 2 business days.   Please advise at patient walked in office

## 2024-11-19 ENCOUNTER — Ambulatory Visit: Admitting: Internal Medicine

## 2024-11-27 ENCOUNTER — Other Ambulatory Visit: Payer: Self-pay | Admitting: Internal Medicine

## 2024-11-27 DIAGNOSIS — G4700 Insomnia, unspecified: Secondary | ICD-10-CM

## 2024-12-10 ENCOUNTER — Other Ambulatory Visit

## 2024-12-11 ENCOUNTER — Other Ambulatory Visit: Payer: Self-pay | Admitting: Internal Medicine

## 2024-12-11 DIAGNOSIS — I10 Essential (primary) hypertension: Secondary | ICD-10-CM

## 2024-12-16 ENCOUNTER — Ambulatory Visit: Admitting: Internal Medicine

## 2024-12-16 ENCOUNTER — Other Ambulatory Visit: Payer: Self-pay | Admitting: Internal Medicine

## 2024-12-16 ENCOUNTER — Telehealth: Payer: Self-pay

## 2024-12-16 DIAGNOSIS — E1140 Type 2 diabetes mellitus with diabetic neuropathy, unspecified: Secondary | ICD-10-CM

## 2024-12-16 MED ORDER — OXYCODONE-ACETAMINOPHEN 5-325 MG PO TABS: 1.0000 | ORAL_TABLET | Freq: Every day | ORAL | 0 refills | Status: AC | PRN

## 2024-12-16 NOTE — Telephone Encounter (Signed)
 Copied from CRM #[id number]. Topic: Clinical - Medication Refill >> Dec 16, 2024  9:42 AM Emylou G wrote: Medication: oxyCODONE -acetaminophen  (PERCOCET/ROXICET) 5-325 MG tablet  Has the patient contacted their pharmacy? No (Agent: If no, request that the patient contact the pharmacy for the refill. If patient does not wish to contact the pharmacy document the reason why and proceed with request.) (Agent: If yes, when and what did the pharmacy advise?)  This is the patient's preferred pharmacy:  [id number] [place], [place] - [address] [facility] [address] [place] [address] Phone: [phone] Fax: [phone]   Is this the correct pharmacy for this prescription? Yes If no, delete pharmacy and type the correct one.   Has the prescription been filled recently? No  Is the patient out of the medication? Yes  Has the patient been seen for an appointment in the last year OR does the patient have an upcoming appointment? Yes  Can we respond through MyChart? Yes  Agent: Please be advised that Rx refills may take up to 3 business days. We ask that you follow-up with your pharmacy.

## 2024-12-16 NOTE — Telephone Encounter (Signed)
 Patient advised

## 2024-12-22 ENCOUNTER — Ambulatory Visit: Admitting: Internal Medicine

## 2024-12-23 ENCOUNTER — Ambulatory Visit: Admitting: Internal Medicine

## 2025-01-26 ENCOUNTER — Ambulatory Visit: Admitting: Internal Medicine

## 2025-08-19 ENCOUNTER — Ambulatory Visit
